# Patient Record
Sex: Male | Born: 1995
Health system: Southern US, Community
[De-identification: ages and names within clinical notes are randomized; demographics above are authoritative.]

## PROBLEM LIST (undated history)

## (undated) DIAGNOSIS — K5909 Other constipation: Secondary | ICD-10-CM

## (undated) DIAGNOSIS — R109 Unspecified abdominal pain: Secondary | ICD-10-CM

## (undated) DIAGNOSIS — K589 Irritable bowel syndrome without diarrhea: Secondary | ICD-10-CM

## (undated) HISTORY — PX: CHOLECYSTECTOMY: SHX55

## (undated) HISTORY — DX: Other constipation: K59.09

## (undated) HISTORY — DX: Unspecified abdominal pain: R10.9

## (undated) HISTORY — DX: Irritable bowel syndrome, unspecified: K58.9

## (undated) HISTORY — PX: MOUTH SURGERY: SHX715

---
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
## 2016-01-04 NOTE — Assessment & Plan Note (Signed)
D/w pt, pt prefers to try otc melatonin first before rx med

## 2016-01-04 NOTE — Assessment & Plan Note (Signed)
Etiology unclear, diff includes hemorrhoid vs fissure, but cant r/o IBD, for GI referral

## 2016-01-04 NOTE — Assessment & Plan Note (Signed)

## 2016-01-04 NOTE — Assessment & Plan Note (Signed)
Etiology unclear, but cant r/o IBD, for GI referral

## 2016-01-06 ENCOUNTER — Encounter: Payer: Self-pay | Admitting: Physician Assistant

## 2016-01-06 ENCOUNTER — Ambulatory Visit (INDEPENDENT_AMBULATORY_CARE_PROVIDER_SITE_OTHER): Payer: 59 | Admitting: Physician Assistant

## 2016-01-06 VITALS — BP 110/70 | HR 76 | Ht 65.0 in | Wt 175.0 lb

## 2016-01-06 DIAGNOSIS — R195 Other fecal abnormalities: Secondary | ICD-10-CM

## 2016-01-06 DIAGNOSIS — R109 Unspecified abdominal pain: Secondary | ICD-10-CM | POA: Diagnosis not present

## 2016-01-06 MED ORDER — HYOSCYAMINE SULFATE SL 0.125 MG SL SUBL: 0.1250 mg | SUBLINGUAL_TABLET | Freq: Four times a day (QID) | SUBLINGUAL | 3 refills | Status: DC | PRN

## 2016-01-06 NOTE — Patient Instructions (Signed)
Your physician has requested that you go to the basement for the following lab work before leaving today: Celiac panel  We have given you samples of the following medication to take: IB guard 2 tabs twice a day. This is also available over the counter.   We have given you a high fiber diet handout. Please strive to have 25-30 mg of fiber daily.  We have given you a LOW FODMAP diet.   We have sent the following medications to your pharmacy for you to pick up at your convenience: Hyoscyamine 0.125 sublingual as needed every 4-6 hrs

## 2016-01-06 NOTE — Progress Notes (Signed)
Agree with assessment and plan with the following recommendations: Agree with low FODMOP diet and trial of hyocyamine and IB Gard. I would recommend a trial of Colestid 1gm BID for this patient who is s/p cholecystectomy, perhaps has some bile salt related diarrhea and this should help. I would hold off on fiber supplementation at this time as well as probiotic and see if this regimen helps. I am happy to see him in follow up. Thanks

## 2016-01-06 NOTE — Progress Notes (Signed)
Chief Complaint: Abdominal Pain  HPI:  [name] is a 20 year old Caucasian male with a past medical history of chronic constipation, who was referred to me by Paul Parsons, Paul W, MD for a complaint of abdominal pain.   Today, the patient presents to clinic accompanied by his mother who does assist with his history. He explains that he had his gallbladder out in 2012 and after that time has never had "normal" bowel movements. His mother tells me that he frequently has abdominal pain and loose stools. Patient tells me that he was doing fine until August when he seemed to have an increase of all of his symptoms. He describes that in the morning he will wake up and have a very sharp gas pain towards the upper half of his stomach, at that point he takes a probiotic and shortly thereafter will have a loose stool. He notes that typically when he has a bowel movement his pain is relieved, but sometimes he can have a generalized discomfort which lasts throughout the day and gives him a decreased appetite. Occasionally he will experience nausea. Typically in the afternoon all the symptoms go away and he is able to eat dinner. Though he does tell me that typically 30 minutes after he eats anything he will develop a gas pain and have a loose bowel movement. This is worse with fatty foods. His mother tells me that at one point he even "pooped out the probiotic full capsule". 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 The patient has been home though for the past 2 weeks and sees no change in his symptoms. His mother tells me they did add flaxseed to his diet to see if this would help as they have a strong family history of IBS, but he has not noticed a difference.   Patient's social history is positive for having seen a therapist in the past for anxiety and insomnia, apparently he meditates on a daily basis at this point.  Patient denies fever, chills, blood in his stool, melena, recent change in diet, recent change in medications, weight loss, fatigue, vomiting, heartburn, reflux or symptoms that awaken him from sleep.  Past Medical History:  Diagnosis Date  . Abdominal pain, recurrent   . Abdominal pain, recurrent   . Asthma   . Chronic constipation    Very large hard stools  . Chronic constipation     Past Surgical History:  Procedure Laterality Date  . CHOLECYSTECTOMY    . ERCP  2012  . MOUTH SURGERY      x 2 , wisdom teeth    Current Outpatient Prescriptions  Medication Sig Dispense Refill  . budesonide-formoterol (SYMBICORT) 160-4.5 MCG/ACT inhaler Inhale 2 puffs into the lungs 2 (two) times daily.    . eszopiclone (LUNESTA) 1 MG TABS tablet Take 1 mg by mouth at bedtime as needed for sleep. Take immediately before bedtime    . Melatonin 10 MG TABS Take 10 mg by mouth daily as needed.     Marland Kitchen. OVER THE COUNTER MEDICATION CBD, 1 capsule nightly    . Hyoscyamine Sulfate SL 0.125 MG SUBL Place 0.125 mg under the tongue every 6 (six) hours as needed. 60 each 3   No current facility-administered medications for this visit.     Allergies as of 01/06/2016 - Review Complete 01/06/2016  Allergen Reaction Noted  . Tetracyclines & related  11/10/2010  .  Zithromax [azithromycin dihydrate]  11/10/2010    Family History  Problem Relation Age of Onset  . Cancer Mother     BREAST  . Alcohol abuse Neg Hx   . Diabetes Neg Hx   . Drug abuse Neg Hx   . Early death Neg Hx   . Heart disease Neg Hx   . Hyperlipidemia Neg Hx   . Hypertension Neg Hx   . Kidney disease Neg Hx   . Stroke Neg Hx     Social History   Social History  . Marital status: Single    Spouse name: N/A  . Number of children: 0  . Years of education: N/A   Occupational History  . student    Social History Main Topics  . Smoking status: Never Smoker  . Smokeless tobacco: Never Used  . Alcohol use No  . Drug use:      Types: Marijuana  . Sexual activity: Not Currently   Other Topics Concern  . Not on file   Social History Narrative  . No narrative on file    Review of Systems:     Constitutional: No weight loss, fever, chills, weakness or fatigue HEENT: Eyes: No change in vision               Ears, Nose, Throat:  No change in hearing  Cardiovascular: No chest pain  Respiratory: No SOB  Gastrointestinal: See HPI and otherwise negative Genitourinary: No dysuria or change in urinary frequency Neurological: No headache, dizziness or syncope Musculoskeletal: No new muscle or joint pain Hematologic: No bleeding or bruising Psychiatric: Positive for anxiety   Physical Exam:  Vital signs: BP 110/70   Pulse 76   Ht 5\' 5"  (1.651 m)   Wt 175 lb (79.4 kg)   BMI 29.12 kg/m   Constitutional:   Pleasant Caucasian male appears to be in NAD, Well developed, Well nourished, alert and cooperative Head:  Normocephalic and atraumatic. Eyes:   PEERL, EOMI. No icterus. Conjunctiva pink. Ears:  Normal auditory acuity. Neck:  Supple Throat: Oral cavity and pharynx without inflammation, swelling or lesion.  Respiratory: Respirations even and unlabored. Lungs clear to auscultation bilaterally.   No wheezes, crackles, or rhonchi.  Cardiovascular: Normal S1, S2. No MRG. Regular rate and rhythm. No peripheral edema, cyanosis or pallor.  Gastrointestinal:  Soft, nondistended, nontender. No rebound or guarding. Normal bowel sounds. No appreciable masses or hepatomegaly. Rectal:  Not performed.  Msk:  Symmetrical without gross deformities. Without edema, no deformity or joint abnormality.  Neurologic:  Alert and  oriented x4;  grossly normal neurologically.  Skin:  Sweaty palms Dry and intact without significant lesions or rashes. Psychiatric:  Demonstrates good judgement and reason without abnormal affect or behaviors. Does appear somewhat anxious  Most Recent Labs: CBC    Component Value Date/Time   WBC 7.3  08/21/2015   WBC 9.0 06/24/2015 1530   RBC 5.47 06/24/2015 1530   HGB 15.6 08/21/2015   HCT 48 08/21/2015   PLT 255 08/21/2015   MCV 85.3 06/24/2015 1530   MCH 30.2 11/27/2010 1825   MCHC 34.3 06/24/2015 1530   RDW 13.1 06/24/2015 1530   LYMPHSABS 3.4 06/24/2015 1530   MONOABS 0.6 06/24/2015 1530   EOSABS 0.2 06/24/2015 1530   BASOSABS 0.0 06/24/2015 1530    CMP     Component Value Date/Time   NA 143 08/21/2015   K 4.5 08/21/2015   CL 100 11/27/2010 1825   CO2 24 11/27/2010 1825  GLUCOSE 81 11/27/2010 1825   BUN 20 08/21/2015   CREATININE 1.1 08/21/2015   CREATININE 0.66 11/27/2010 1825   CALCIUM 10.2 11/27/2010 1825   PROT 7.4 11/27/2010 1825   ALBUMIN 4.1 11/27/2010 1825   AST 18 08/21/2015   ALT 28 08/21/2015   ALKPHOS 110 08/21/2015   BILITOT 4.2 (H) 11/27/2010 1825   GFRNONAA NOT CALCULATED 11/27/2010 1825   GFRAA NOT CALCULATED 11/27/2010 1825    Assessment: 1. Abdominal pain: He describes daily abdominal pain typically upon waking, better after a bowel movement and loose stools and pain after eating throughout the day, does admit to being stressed and anxious on a daily basis, symptoms seem to have increased when he returned to college in the fall, patient is status post cholecystectomy in 2012, has always had some chronic loose stools; most likely patient's symptoms are due to irritable bowel syndrome 2. Loose stools: See above, patient does tell me they have some form, but have always been toward the looser side  Plan: 1. Discussed at great length today irritable bowel syndrome. I did discuss that this is a diagnosis of exclusion and can only truly be diagnosed after colonoscopy is inconclusive. Patient's mother declines a colonoscopy today and prefers to try conservative measures first. 2. Discussed with the patient should continue to find ways to relieve stress and anxiety, also discussed that low-dose SSRIs or TCAs can be used to help with IBS 3. Recommend  the patient increase fiber in his diet to at least 25-35 g per day. Patient was provided with a high fiber handout. Discussed that he can do this as a fiber supplement. 4. Recommend the patient start a daily probiotic such as Align-continue this for at least 2 mos 5. Provided patient with information regarding the low FODMAP diet. 6. Prescribed Hyoscyamine sulfate 0.125 mg sublingual tabs to be used when necessary every 4-6 hours for abdominal cramping 7. Reccomend the patient increase water intake to at least 6-8 8 ounce glasses of water per day 8. Provided the patient with samples of IB Delene RuffiniGard, discussed this can take 4-6 weeks for its full effect, recommend patient start 2 capsules twice a day. Provided him with some samples. 9. Patient to follow in clinic in 2-3 mos with myself or Dr. 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

## 2016-03-01 ENCOUNTER — Ambulatory Visit: Payer: 59 | Admitting: Gastroenterology

## 2016-03-21 ENCOUNTER — Encounter: Payer: Self-pay | Admitting: Physician Assistant

## 2016-03-21 ENCOUNTER — Ambulatory Visit (INDEPENDENT_AMBULATORY_CARE_PROVIDER_SITE_OTHER): Payer: 59 | Admitting: Physician Assistant

## 2016-03-21 VITALS — BP 120/72 | HR 64 | Ht 64.5 in | Wt 187.1 lb

## 2016-03-21 DIAGNOSIS — K580 Irritable bowel syndrome with diarrhea: Secondary | ICD-10-CM | POA: Diagnosis not present

## 2016-03-21 NOTE — Progress Notes (Signed)
Chief Complaint: IBS  HPI:  Mr. Cashman is a 21 year old Caucasian male with a past medical history of chronic constipation, who returns to clinic today for follow-up of his abdominal pain and diarrhea.    Please recall patient was initially seen by me on 01/06/16 and described that since he had his gallbladder out in 2012, he had never had "normal" bowel movements. He described frequent loose stools with abdominal pain. These seemed to be related to stressful situations. He also described occasional nausea. At that time IBS was suspected and patient was started on a high fiber diet as well as a daily probiotic along with IB guard, low-FODMAP diet and hyoscyamine when necessary. He was assigned to Dr. Adela Lank.   Today, the patient tells me that his symptoms are "much improved". The patient did abide by the low FODMAP diet strictly for about 6 weeks and felt that he returned to almost normal with his bowel movements.  He has also been adding fibersupplementation on a daily basis along with IB Guard 1 tab twice a day. He tells me that overall he has regular bowel movements and has no symptoms. He does tell me that occasionally if he has a stressful period in school with a lot of testing he will then get some abdominal cramping in the morning and diarrhea. He tells me he typically will then take hyoscyamine which will help the symptoms. There has only been one or 2 days per the patient where he felt uncertain about going to class due to his diarrhea. Overall he is very happy.   Patient denies fever, chills, blood in his stool, melena, weight loss, fatigue, anorexia, nausea, vomiting, heartburn or reflux.  Past Medical History:  Diagnosis Date  . Abdominal pain, recurrent   . Abdominal pain, recurrent   . Asthma   . Chronic constipation    Very large hard stools  . Chronic constipation     Past Surgical History:  Procedure Laterality Date  . CHOLECYSTECTOMY    . ERCP  2012  . MOUTH SURGERY       x 2 , wisdom teeth    Current Outpatient Prescriptions  Medication Sig Dispense Refill  . budesonide-formoterol (SYMBICORT) 160-4.5 MCG/ACT inhaler Inhale 2 puffs into the lungs as needed.     . eszopiclone (LUNESTA) 1 MG TABS tablet Take 1 mg by mouth at bedtime as needed for sleep. Take immediately before bedtime    . FIBER ADULT GUMMIES PO Take by mouth. Takes two b.i.d    . Hyoscyamine Sulfate SL 0.125 MG SUBL Place 0.125 mg under the tongue every 6 (six) hours as needed. 60 each 3  . Melatonin 10 MG TABS Take 10 mg by mouth daily as needed.     Marland Kitchen PRESCRIPTION MEDICATION Ibgard 1 2x daily     No current facility-administered medications for this visit.     Allergies as of 03/21/2016 - Review Complete 03/21/2016  Allergen Reaction Noted  . Tetracyclines & related  11/10/2010  . Zithromax [azithromycin dihydrate]  11/10/2010    Family History  Problem Relation Age of Onset  . Cancer Mother     BREAST  . Alcohol abuse Neg Hx   . Diabetes Neg Hx   . Drug abuse Neg Hx   . Early death Neg Hx   . Heart disease Neg Hx   . Hyperlipidemia Neg Hx   . Hypertension Neg Hx   . Kidney disease Neg Hx   . Stroke Neg Hx  Social History   Social History  . Marital status: Single    Spouse name: N/A  . Number of children: 0  . Years of education: N/A   Occupational History  . student    Social History Main Topics  . Smoking status: Never Smoker  . Smokeless tobacco: Never Used  . Alcohol use No  . Drug use: Yes    Types: Marijuana  . Sexual activity: Not Currently   Other Topics Concern  . Not on file   Social History Narrative  . No narrative on file    Review of Systems:    Constitutional: No weight loss, fever or chills Cardiovascular: No chest pain Respiratory: No SOB  Gastrointestinal: See HPI and otherwise negative Psychiatric: Positive for anxiety   Physical Exam:  Vital signs: BP 120/72   Pulse 64   Ht 5' 4.5" (1.638 m)   Wt 187 lb 2 oz (84.9  kg)   BMI 31.62 kg/m   Constitutional:   Pleasant Caucasian male appears to be in NAD, Well developed, Well nourished, alert and cooperative Respiratory: Respirations even and unlabored. Lungs clear to auscultation bilaterally.   No wheezes, crackles, or rhonchi.  Cardiovascular: Normal S1, S2. No MRG. Regular rate and rhythm. No peripheral edema, cyanosis or pallor.  Gastrointestinal:  Soft, nondistended, nontender. No rebound or guarding. Normal bowel sounds. No appreciable masses or hepatomegaly. Psychiatric:  Demonstrates good judgement and reason without abnormal affect or behaviors.  No recent labs or imaging.  Assessment: 1. IBS: Suspected at time of last visit with abdominal pain, nausea and diarrhea related to stress and anxiety, patient better now on fiber, IB guard, low-FODMAP diet and hyoscyamine when necessary  Plan: 1. Continue hyoscyamine 0.125 mg sublingual tabs every 4-6 hours when necessary 2. Continue increased fiber 3. Did discuss with the patient that if he has a "bad day", he could try taking Imodium before going to class at least make him feel more secure during that time 4. Continue IB guard. Did provide the patient with further coupons for this 1 tab twice a day 5. Patient to follow in clinic with Dr. Adela LankArmbruster or myself as needed in the future.  Hyacinth MeekerJennifer Magda Muise, PA-C [facility] 03/21/2016, 11:32 AM  Cc: Corwin LevinsJohn, [name], MD

## 2016-03-21 NOTE — Patient Instructions (Signed)
If you are age 20 or older, your body mass index should be between 23-30. Your Body mass index is 31.62 kg/m. If this is out of the aforementioned range listed, please consider follow up with your Primary Care Provider.  If you are age 20 or younger, your body mass index should be between 19-25. Your Body mass index is 31.62 kg/m. If this is out of the aformentioned range listed, please consider follow up with your Primary Care Provider.  Follow up as needed.   Thank you for choosing [facility]

## 2016-03-21 NOTE — Progress Notes (Signed)
Agree with assessment and plan. If loose stools persist would try him on colestid or cholestryramine in the future given history of cholecystectomy. He can follow up as needed.

## 2016-05-25 ENCOUNTER — Other Ambulatory Visit: Payer: Self-pay | Admitting: Internal Medicine

## 2016-05-25 NOTE — Telephone Encounter (Signed)
Done hardcopy to Shirron  

## 2016-05-26 NOTE — Telephone Encounter (Signed)
faxed

## 2016-05-31 ENCOUNTER — Telehealth: Payer: Self-pay | Admitting: Physician Assistant

## 2016-05-31 NOTE — Telephone Encounter (Signed)
Patient mother states that patient needs stomach medication refilled at [facility]. If pt needs to come in for ov he can come august 6-9 bc he will be in from out of town where he lives.

## 2016-06-01 MED ORDER — HYOSCYAMINE SULFATE SL 0.125 MG SL SUBL: 0.1250 mg | SUBLINGUAL_TABLET | Freq: Four times a day (QID) | SUBLINGUAL | 1 refills | Status: DC | PRN

## 2016-06-01 NOTE — Telephone Encounter (Signed)
Hyoscyamine rx  sent to [facility] for number 180 with 1 refill.

## 2016-07-04 ENCOUNTER — Telehealth: Payer: Self-pay | Admitting: Physician Assistant

## 2016-07-04 NOTE — Telephone Encounter (Signed)
Patient moved to Dr. Venida JarvisArmbrusters schedule

## 2016-07-07 ENCOUNTER — Ambulatory Visit (INDEPENDENT_AMBULATORY_CARE_PROVIDER_SITE_OTHER): Payer: 59 | Admitting: Gastroenterology

## 2016-07-07 ENCOUNTER — Other Ambulatory Visit: Payer: Self-pay | Admitting: Gastroenterology

## 2016-07-07 ENCOUNTER — Encounter (INDEPENDENT_AMBULATORY_CARE_PROVIDER_SITE_OTHER): Payer: Self-pay

## 2016-07-07 ENCOUNTER — Ambulatory Visit: Payer: 59 | Admitting: Physician Assistant

## 2016-07-07 ENCOUNTER — Telehealth: Payer: Self-pay | Admitting: Gastroenterology

## 2016-07-07 ENCOUNTER — Encounter: Payer: Self-pay | Admitting: Gastroenterology

## 2016-07-07 VITALS — BP 118/76 | HR 73 | Ht 65.0 in | Wt 198.0 lb

## 2016-07-07 DIAGNOSIS — K580 Irritable bowel syndrome with diarrhea: Secondary | ICD-10-CM | POA: Diagnosis not present

## 2016-07-07 MED ORDER — AMITRIPTYLINE HCL 25 MG PO TABS: 25.0000 mg | ORAL_TABLET | Freq: Every day | ORAL | 3 refills | Status: DC

## 2016-07-07 NOTE — Telephone Encounter (Signed)
At pts appt today with Armbruster I sent in Elavil to [facility] on [address]. I left message on voicemail informing of this. Asked for mom or son to return my call if it needs to be sent to [facility]. I did not feel comfortable sending the script to the fax provided until I had spoken to one of them.

## 2016-07-07 NOTE — Progress Notes (Signed)
HPI :  21 year old male with a suspected history of irritable bowel syndrome, known to [employer] from prior visits, new to me.   Patient thinks he is doing well in general with occasional flares of symptoms. He reports when stressed out with exams at school, he gets gassey and pain with his bowel movements. Pain is relieved with a bowel movement. He thinks symptoms ongoing for roughly a year or so. He feels it most when he is stressed out and types of food he eats. Since going to college his eating habits have been altered.   He reports about BM once per day at baseline. When feeling poorly he has more frequent bowel movements - smaller loose stools. No blood in the stools. Pain is in the mid to lower abdomen. It is reliably relieved with a bowel movement. Weight is stable - no weight loss. No nausea or vomiting at baseline. Hyocyamine will help his pain if he needs it. Spicey foods bother him most or high fatty foods. He thinks most of the food on his campus where he goes to school bothers him most. He has had his gallbladder removed in 2012. He thinks he has had loose stools since the gallbladder has been removed.   He thinks IB gard helps him significantly, taking it twice daily. He has tried a low FODMAP diet which is hard to follow being in school but he thinks helped when he took it.     Past Medical History:  Diagnosis Date  . Abdominal pain, recurrent   . Abdominal pain, recurrent   . Asthma   . Chronic constipation    Very large hard stools  . Chronic constipation   . IBS (irritable bowel syndrome)      Past Surgical History:  Procedure Laterality Date  . CHOLECYSTECTOMY    . ERCP  2012  . MOUTH SURGERY      x 2 , wisdom teeth   Family History  Problem Relation Age of Onset  . Cancer Mother        BREAST  . Alcohol abuse Neg Hx   . Diabetes Neg Hx   . Drug abuse Neg Hx   . Early death Neg Hx   . Heart disease Neg Hx   . Hyperlipidemia Neg Hx   . Hypertension  Neg Hx   . Kidney disease Neg Hx   . Stroke Neg Hx    Social History  Substance Use Topics  . Smoking status: Never Smoker  . Smokeless tobacco: Never Used  . Alcohol use No   Current Outpatient Prescriptions  Medication Sig Dispense Refill  . budesonide-formoterol (SYMBICORT) 160-4.5 MCG/ACT inhaler Inhale 2 puffs into the lungs as needed.     . eszopiclone (LUNESTA) 1 MG TABS tablet TAKE 1 TABLET AT BEDTIME (TAKE IMMEDIATELY BEFORE BEDTIME) 30 tablet 2  . FIBER ADULT GUMMIES PO Take by mouth. Takes two b.i.d    . Hyoscyamine Sulfate SL 0.125 MG SUBL Place 0.125 mg under the tongue every 6 (six) hours as needed. 180 each 1  . Melatonin 10 MG TABS Take 10 mg by mouth daily as needed.     Marland Kitchen PRESCRIPTION MEDICATION Ibgard 1 2x daily    . amitriptyline (ELAVIL) 25 MG tablet Take 1 tablet (25 mg total) by mouth at bedtime. Take 1 tablet qhs for 2 weeks. Then increase to 2 tablets qhs if needed 90 tablet 3   No current facility-administered medications for this visit.    Allergies  Allergen Reactions  . Tetracyclines & Related   . Zithromax [Azithromycin Dihydrate]      Review of Systems: All systems reviewed and negative except where noted in HPI.   Lab Results  Component Value Date   WBC 7.3 08/21/2015   HGB 15.6 08/21/2015   HCT 48 08/21/2015   MCV 85.3 06/24/2015   PLT 255 08/21/2015    Lab Results  Component Value Date   CREATININE 1.1 08/21/2015   BUN 20 08/21/2015   NA 143 08/21/2015   K 4.5 08/21/2015   CL 100 11/27/2010   CO2 24 11/27/2010     Physical Exam: BP 118/76   Pulse 73   Ht 5\' 5"  (1.651 m)   Wt 198 lb (89.8 kg)   BMI 32.95 kg/m  Constitutional: Pleasant,well-developed, male in no acute distress. HEENT: Normocephalic and atraumatic. Conjunctivae are normal. No scleral icterus. Neck supple.  Cardiovascular: Normal rate, regular rhythm.  Pulmonary/chest: Effort normal and breath sounds normal. No wheezing, rales or rhonchi. Abdominal: Soft,  nondistended, nontender. There are no masses palpable. No hepatomegaly. Extremities: no edema Lymphadenopathy: No cervical adenopathy noted. Neurological: Alert and oriented to person place and time. Skin: Skin is warm and dry. No rashes noted. Psychiatric: Normal mood and affect. Behavior is normal.   ASSESSMENT AND PLAN: 21 year old male here for reassessment for what is suspected to be irritable bowel syndrome. He has chronic loose stools with bloating, abdominal pain that is reliably relieved with a bowel movement. No alarm symptoms or anemia. He is a [employer] student, symptoms worse when he cannot control his diet and when under stress of exams. He responded nicely to IB [place] and hyoscyamine as needed, although continues to have symptoms at times.  We discussed long-term options for maintenance therapy to reduce his use of as needed medications. He reports abdominal cramping as well as bothers him most, in this light I think Elavil may be a good option for him. I discussed risks and benefits of this and we'll start him at 25 mg daily at bedtime 2 weeks. If he tolerates this well we can increase Elavil to 50 mg daily at bedtime. While I hope this helps reduce his stools as well, if diarrhea persists he can also try Colestid as needed given his history of cholecystectomy. In the interim going to screen him for celiac disease to ensure negative, this has not been done.  He can follow-up with me in 6 months or moving forward call with questions or concerns in the interim.Ileene Patrick.  Willene Holian, MD [facility] Pager [phone]

## 2016-07-07 NOTE — Patient Instructions (Signed)
If you are age 20 or older, your body mass index should be between 23-30. Your Body mass index is 32.95 kg/m. If this is out of the aforementioned range listed, please consider follow up with your Primary Care Provider.  If you are age 20 or younger, your body mass index should be between 19-25. Your Body mass index is 32.95 kg/m. If this is out of the aformentioned range listed, please consider follow up with your Primary Care Provider.   We have sent the following medications to your pharmacy for you to pick up at your convenience:  Elavil  Please continue Hyocyamine and IBgard as instructed.  Thank you.

## 2016-07-07 NOTE — Telephone Encounter (Signed)
Spoke with pts mom. She is needing the lab order sent to [facility]. I informed that the order was attached to pts AVS when he left the appt today. She states that son did not bring it with him to [facility]. Order faxed to [phone].

## 2016-07-08 ENCOUNTER — Ambulatory Visit: Payer: 59 | Admitting: Physician Assistant

## 2016-07-08 LAB — TISSUE TRANSGLUTAMINASE, IGG: Tissue Transglut Ab: <2 U/mL (ref 0–5)

## 2016-07-08 LAB — TISSUE TRANSGLUTAMINASE, IGA: Transglutaminase IgA: <2 U/mL (ref 0–3)

## 2016-07-11 ENCOUNTER — Telehealth: Payer: Self-pay | Admitting: Gastroenterology

## 2016-07-11 NOTE — Telephone Encounter (Signed)
Patient started taking the amitriptyline the other night, along with his melatonin. States this makes him very groggy, is difficult to wake up in the morning. He took 1/2 tablet of the amitriptyline along with the melatonin, with the same affect. Instructed him to not take the amitriptyline tonight, would ask the doctor about this and get back with him tomorrow. Please advise.

## 2016-07-12 ENCOUNTER — Encounter: Payer: Self-pay | Admitting: Gastroenterology

## 2016-07-12 NOTE — Telephone Encounter (Signed)
Thanks for the update. He should try taking the Elavil at 1/2 pill without the melatonin. If still the same side effect without using melatonin then will need to consider an alternative. Usually most people don't have this side effect at the low dose but if it persists he should contact us and will discuss using an alternative. Thanks

## 2016-07-12 NOTE — Telephone Encounter (Signed)
Spoke to patient, he did try taking the Elavil 1/2 tablet and no melatonin last night, still somewhat groggy this morning. He will try this a couple more days, and if still feeling like this will call back to see about changing medications.

## 2016-08-08 ENCOUNTER — Telehealth: Payer: Self-pay

## 2016-08-08 ENCOUNTER — Other Ambulatory Visit: Payer: Self-pay

## 2016-08-08 MED ORDER — COLESTIPOL HCL 1 G PO TABS: 1.0000 g | ORAL_TABLET | Freq: Two times a day (BID) | ORAL | 3 refills | Status: DC

## 2016-08-08 NOTE — Telephone Encounter (Signed)
Thanks Raynelle FanningJulie, sorry to hear he isn't responding well to the Elavil. Recommend he stop it altogether at this point. Why don't we try him on colestid 1gm BID given his history of cholecystectomy and see if this helps his diarrhea. He can follow up with me in 3 months for reassessment in the clinic. Thanks

## 2016-08-08 NOTE — Telephone Encounter (Signed)
See last phone note (6/25) about medication. Please advise.

## 2016-08-08 NOTE — Telephone Encounter (Signed)
Pt has been taking the Elavil for about a month now. He doesn't see it helping it only makes him tired and feels groggy in the mornings.  Best phone number to reach pt is [phone]

## 2016-08-08 NOTE — Telephone Encounter (Signed)
Left message for patient to call back to office. I have sent Rx for Colestid to his pharmacy. Need to speak to him about Dr. Lanetta InchArmbruster's recommendations to stop Elavil, try Colestid and follow up in office in 3 months.

## 2016-08-10 ENCOUNTER — Other Ambulatory Visit: Payer: Self-pay

## 2016-08-10 NOTE — Telephone Encounter (Signed)
I have not heard back from patient, sent him letter with Dr. Lanetta InchArmbruster's recommendations. Asked him to contact our office to schedule a 3 month follow up visit. If he had questions or concerns prior to visit to please call our office.

## 2016-08-23 ENCOUNTER — Ambulatory Visit: Payer: 59 | Admitting: Internal Medicine

## 2016-08-26 ENCOUNTER — Ambulatory Visit (INDEPENDENT_AMBULATORY_CARE_PROVIDER_SITE_OTHER): Payer: 59 | Admitting: Internal Medicine

## 2016-08-26 ENCOUNTER — Encounter: Payer: Self-pay | Admitting: Internal Medicine

## 2016-08-26 VITALS — BP 126/82 | HR 69 | Ht 65.0 in | Wt 202.0 lb

## 2016-08-26 DIAGNOSIS — J309 Allergic rhinitis, unspecified: Secondary | ICD-10-CM | POA: Diagnosis not present

## 2016-08-26 DIAGNOSIS — Z0000 Encounter for general adult medical examination without abnormal findings: Secondary | ICD-10-CM

## 2016-08-26 DIAGNOSIS — E669 Obesity, unspecified: Secondary | ICD-10-CM

## 2016-08-26 DIAGNOSIS — F5101 Primary insomnia: Secondary | ICD-10-CM | POA: Diagnosis not present

## 2016-08-26 DIAGNOSIS — F419 Anxiety disorder, unspecified: Secondary | ICD-10-CM | POA: Diagnosis not present

## 2016-08-26 MED ORDER — ESZOPICLONE 1 MG PO TABS: ORAL_TABLET | ORAL | 1 refills | Status: DC

## 2016-08-26 NOTE — Patient Instructions (Signed)

## 2016-08-26 NOTE — Progress Notes (Signed)
Subjective:    Patient ID: Paul Parsons, male    DOB: 02-07-1995, 21 y.o.   MRN: [id number]  HPI  Here to f/u before fall class start at [employer]. Does have several wks ongoing nasal allergy symptoms with clearish congestion, itch and sneezing, without fever, pain, ST, cough, swelling or wheezing.  Has increased stress that at times seems overwhelming, Denies worsening depressive symptoms, suicidal ideation, or panic; has ongoing anxiety.  Biggest complaint is also getting to sleep most nights in the past month have been very difficult, mind just wont stop.  Not waking up early.  Pt denies fever, wt loss, night sweats, loss of appetite, or other constitutional symptoms Past Medical History:  Diagnosis Date  . Abdominal pain, recurrent   . Abdominal pain, recurrent   . Asthma   . Chronic constipation    Very large hard stools  . Chronic constipation   . IBS (irritable bowel syndrome)    Past Surgical History:  Procedure Laterality Date  . CHOLECYSTECTOMY    . ERCP  2012  . MOUTH SURGERY      x 2 , wisdom teeth    reports that he has never smoked. He has never used smokeless tobacco. He reports that he uses drugs, including Marijuana. He reports that he does not drink alcohol. family history includes Cancer in his mother. Allergies  Allergen Reactions  . Tetracyclines & Related   . Zithromax [Azithromycin Dihydrate]    Current Outpatient Prescriptions on File Prior to Visit  Medication Sig Dispense Refill  . amitriptyline (ELAVIL) 25 MG tablet Take 1 tablet (25 mg total) by mouth at bedtime. Take 1 tablet qhs for 2 weeks. Then increase to 2 tablets qhs if needed 90 tablet 3  . budesonide-formoterol (SYMBICORT) 160-4.5 MCG/ACT inhaler Inhale 2 puffs into the lungs as needed.     . colestipol (COLESTID) 1 g tablet Take 1 tablet (1 g total) by mouth 2 (two) times daily. 60 tablet 3  . FIBER ADULT GUMMIES PO Take by mouth. Takes two b.i.d    . Hyoscyamine Sulfate SL 0.125 MG  SUBL Place 0.125 mg under the tongue every 6 (six) hours as needed. 180 each 1  . Melatonin 10 MG TABS Take 10 mg by mouth daily as needed.     Marland Kitchen PRESCRIPTION MEDICATION Ibgard 1 2x daily     No current facility-administered medications on file prior to visit.    Review of Systems  Constitutional: Negative for other unusual diaphoresis or sweats HENT: Negative for ear discharge or swelling Eyes: Negative for other worsening visual disturbances Respiratory: Negative for stridor or other swelling  Gastrointestinal: Negative for worsening distension or other blood Genitourinary: Negative for retention or other urinary change Musculoskeletal: Negative for other MSK pain or swelling Skin: Negative for color change or other new lesions Neurological: Negative for worsening tremors and other numbness  Psychiatric/Behavioral: Negative for worsening agitation or other fatigue All other system neg per pt    Objective:   Physical Exam BP 126/82   Pulse 69   Ht 5\' 5"  (1.651 m)   Wt 202 lb (91.6 kg)   SpO2 99%   BMI 33.61 kg/m  VS noted,  Constitutional: Pt appears in NAD HENT: Head: NCAT.  Right Ear: External ear normal.  Left Ear: External ear normal.  Eyes: . Pupils are equal, round, and reactive to light. Conjunctivae and EOM are normal Bilat tm's with mild erythema.  Max sinus areas non tender.  Pharynx with  mild erythema, no exudate Nose: without d/c or deformity Neck: Neck supple. Gross normal ROM Cardiovascular: Normal rate and regular rhythm.   Pulmonary/Chest: Effort normal and breath sounds without rales or wheezing.  Neurological: Pt is alert. At baseline orientation, motor grossly intact Skin: Skin is warm. No rashes, other new lesions, no LE edema Psychiatric: Pt behavior is normal without agitation , 1-2+ nervous,k not depressed affect No other exam findings Lab Results  Component Value Date   TSH 2.070 03/18/2014      Assessment & Plan:

## 2016-08-29 DIAGNOSIS — F419 Anxiety disorder, unspecified: Secondary | ICD-10-CM | POA: Insufficient documentation

## 2016-08-29 NOTE — Assessment & Plan Note (Signed)
Urged for more regular exercise, wt control

## 2016-08-29 NOTE — Assessment & Plan Note (Signed)
Ok for lunesta qhs prn,  to f/u any worsening symptoms or concerns

## 2016-08-29 NOTE — Assessment & Plan Note (Signed)
Mild, pt prefers otc claritin prn,  to f/u any worsening symptoms or concerns

## 2016-08-29 NOTE — Assessment & Plan Note (Signed)
Mild to mod, no panic or depression, after d/w pt he [facility] where [facility] soon, plans to self refer

## 2016-09-01 ENCOUNTER — Telehealth: Payer: Self-pay | Admitting: Gastroenterology

## 2016-09-01 NOTE — Telephone Encounter (Signed)
Notified patient of this information

## 2016-09-01 NOTE — Telephone Encounter (Signed)
I have not seen any paperwork. I checked Dr. Lanetta InchArmbruster's desk and forms are not there. I have also checked in my files and I have nothing. Will you please inform his mom. Thank you.

## 2016-09-09 NOTE — Telephone Encounter (Signed)
Have you seen this paperwork?

## 2016-10-18 ENCOUNTER — Telehealth: Payer: Self-pay | Admitting: Gastroenterology

## 2016-10-18 ENCOUNTER — Other Ambulatory Visit: Payer: Self-pay

## 2016-10-18 MED ORDER — COLESTIPOL HCL 1 G PO TABS: 1.0000 g | ORAL_TABLET | Freq: Two times a day (BID) | ORAL | 0 refills | Status: DC

## 2016-11-05 ENCOUNTER — Other Ambulatory Visit: Payer: Self-pay | Admitting: Gastroenterology

## 2016-12-07 ENCOUNTER — Telehealth: Payer: Self-pay

## 2016-12-07 NOTE — Telephone Encounter (Signed)
This is the wrong pt. It was his father who called about himself. Message created in error.

## 2016-12-07 NOTE — Telephone Encounter (Signed)
pt states that [facility] is closing in [place] and he needs to find someone that will take his insurance which is [employer].  do you know of another locations. he can try to get the adhd testing done at

## 2016-12-07 NOTE — Telephone Encounter (Signed)
i dont see in the system where you seen this patient did you see in the hopsital ?

## 2017-01-04 ENCOUNTER — Encounter: Payer: Self-pay | Admitting: Physician Assistant

## 2017-01-04 ENCOUNTER — Encounter: Payer: Self-pay | Admitting: Internal Medicine

## 2017-01-04 ENCOUNTER — Ambulatory Visit: Payer: 59 | Admitting: Physician Assistant

## 2017-01-04 ENCOUNTER — Ambulatory Visit: Payer: 59 | Admitting: Internal Medicine

## 2017-01-04 VITALS — BP 128/86 | HR 100 | Temp 98.2°F | Ht 65.0 in | Wt 213.0 lb

## 2017-01-04 VITALS — BP 108/76 | HR 70 | Ht 65.0 in | Wt 212.0 lb

## 2017-01-04 DIAGNOSIS — Z0000 Encounter for general adult medical examination without abnormal findings: Secondary | ICD-10-CM

## 2017-01-04 DIAGNOSIS — K580 Irritable bowel syndrome with diarrhea: Secondary | ICD-10-CM

## 2017-01-04 DIAGNOSIS — F5101 Primary insomnia: Secondary | ICD-10-CM | POA: Diagnosis not present

## 2017-01-04 MED ORDER — ESZOPICLONE 2 MG PO TABS: 2.0000 mg | ORAL_TABLET | Freq: Every evening | ORAL | 0 refills | Status: DC | PRN

## 2017-01-04 NOTE — Progress Notes (Signed)
Chief Complaint: IBS with diarrhea  HPI:    [name] is a 21 year old male with a past medical history as listed below including IBS with diarrhea, who follows with Dr. Adela LankArmbruster and returns to clinic today for follow-up.    Please recall patient was last seen in clinic 07/07/16 by Dr. Adela LankArmbruster and at that time had been doing some better on FD Gard and hyoscyamine.  He was started on Elavil 25 mg at bedtime for 2 weeks.  He was also started on Colestid.  Patient later called our clinic and said he could not tolerate the Elavil.  This was discontinued.  He was recommended to take Colestid 1 g twice daily.    Today, the patient returns to clinic and tells me that he is "way better than when I first came in here".  Patient tells me that about 3 weeks ago he developed a "stomach bug", he does tell me the type of bacteria and that he was on antibiotics for about a week.  He tells me that after stopping the antibiotics that he did have some nausea residually for a few days, but this is much better.  He has noted a decrease in appetite though since this time.  He tells me this could be related to this or he also due to the fact that he had an increase in stress of at the end of the year due to finals at school.  He is now off from school and tells me that his appetite is picking back up but "still not quite normal".    Patient also continues with a generalized abdominal discomfort when he wakes up in the morning, sometimes this discomfort will grow and result in a liquid bowel movement at other times will not.  The patient is currently using his hyoscyamine as needed and tells me that in the morning if this pain gets really bad he will take one and it does help to "take the edge off".  He is taking his Colestid 1 g twice daily and is unsure if this is helping but does tell me that he has had a decrease in diarrhea.  Overall the patient is "pretty happy".      Patient denies fever, chills, blood in stool,  melena, weight loss, anorexia, vomiting or symptoms that awaken him at night.  Past Medical History:  Diagnosis Date  . Abdominal pain, recurrent   . Abdominal pain, recurrent   . Asthma   . Chronic constipation    Very large hard stools  . Chronic constipation   . IBS (irritable bowel syndrome)     Past Surgical History:  Procedure Laterality Date  . CHOLECYSTECTOMY    . ERCP  2012  . MOUTH SURGERY      x 2 , wisdom teeth    Current Outpatient Medications  Medication Sig Dispense Refill  . amitriptyline (ELAVIL) 25 MG tablet Take 1 tablet (25 mg total) by mouth at bedtime. Take 1 tablet qhs for 2 weeks. Then increase to 2 tablets qhs if needed 90 tablet 3  . budesonide-formoterol (SYMBICORT) 160-4.5 MCG/ACT inhaler Inhale 2 puffs into the lungs as needed.     . colestipol (COLESTID) 1 g tablet TAKE 1 TABLET BY MOUTH TWO  TIMES DAILY 180 tablet 0  . eszopiclone (LUNESTA) 1 MG TABS tablet TAKE 1 TABLET AT BEDTIME (TAKE IMMEDIATELY BEFORE BEDTIME) 90 tablet 1  . FIBER ADULT GUMMIES PO Take by mouth. Takes two b.i.d    .  Hyoscyamine Sulfate SL 0.125 MG SUBL Place 0.125 mg under the tongue every 6 (six) hours as needed. 180 each 1  . Melatonin 10 MG TABS Take 10 mg by mouth daily as needed.     Marland Kitchen PRESCRIPTION MEDICATION Ibgard 1 2x daily     No current facility-administered medications for this visit.     Allergies as of 01/04/2017 - Review Complete 01/04/2017  Allergen Reaction Noted  . Tetracyclines & related  11/10/2010  . Zithromax [azithromycin dihydrate]  11/10/2010    Family History  Problem Relation Age of Onset  . Breast cancer Mother        BREAST  . Alcohol abuse Neg Hx   . Diabetes Neg Hx   . Drug abuse Neg Hx   . Early death Neg Hx   . Heart disease Neg Hx   . Hyperlipidemia Neg Hx   . Hypertension Neg Hx   . Kidney disease Neg Hx   . Stroke Neg Hx   . Colon cancer Neg Hx   . Stomach cancer Neg Hx     Social History   Socioeconomic History  .  Marital status: Single    Spouse name: Not on file  . Number of children: 0  . Years of education: Not on file  . Highest education level: Not on file  Social Needs  . Financial resource strain: Not on file  . Food insecurity - worry: Not on file  . Food insecurity - inability: Not on file  . Transportation needs - medical: Not on file  . Transportation needs - non-medical: Not on file  Occupational History  . Occupation: Consulting civil engineer  Tobacco Use  . Smoking status: Never Smoker  . Smokeless tobacco: Never Used  Substance and Sexual Activity  . Alcohol use: No    Alcohol/week: 1.8 oz    Types: 3 Cans of beer per week  . Drug use: No    Comment: former  . Sexual activity: Not Currently  Other Topics Concern  . Not on file  Social History Narrative  . Not on file    Review of Systems:    Constitutional: No weight loss, fever or chills Cardiovascular: No chest pain Respiratory: No SOB Gastrointestinal: See HPI and otherwise negative   Physical Exam:  Vital signs: BP 108/76   Pulse 70   Ht 5\' 5"  (1.651 m)   Wt 212 lb (96.2 kg)   BMI 35.28 kg/m   Constitutional:   Pleasant overweight Caucasian male appears to be in NAD, Well developed, Well nourished, alert and cooperative Respiratory: Respirations even and unlabored. Lungs clear to auscultation bilaterally.   No wheezes, crackles, or rhonchi.  Cardiovascular: Normal S1, S2. No MRG. Regular rate and rhythm. No peripheral edema, cyanosis or pallor.  Gastrointestinal:  Soft, nondistended, nontender. No rebound or guarding. Normal bowel sounds. No appreciable masses or hepatomegaly. Psychiatric:  Demonstrates good judgement and reason without abnormal affect or behaviors.  RELEVANT LABS AND IMAGING: CBC    Component Value Date/Time   WBC 7.3 08/21/2015   WBC 9.0 06/24/2015 1530   RBC 5.47 06/24/2015 1530   HGB 15.6 08/21/2015   HCT 48 08/21/2015   PLT 255 08/21/2015   MCV 85.3 06/24/2015 1530   MCH 30.2 11/27/2010 1825    MCHC 34.3 06/24/2015 1530   RDW 13.1 06/24/2015 1530   LYMPHSABS 3.4 06/24/2015 1530   MONOABS 0.6 06/24/2015 1530   EOSABS 0.2 06/24/2015 1530   BASOSABS 0.0 06/24/2015 1530  CMP     Component Value Date/Time   NA 143 08/21/2015   K 4.5 08/21/2015   CL 100 11/27/2010 1825   CO2 24 11/27/2010 1825   GLUCOSE 81 11/27/2010 1825   BUN 20 08/21/2015   CREATININE 1.1 08/21/2015   CREATININE 0.66 11/27/2010 1825   CALCIUM 10.2 11/27/2010 1825   PROT 7.4 11/27/2010 1825   ALBUMIN 4.1 11/27/2010 1825   AST 18 08/21/2015   ALT 28 08/21/2015   ALKPHOS 110 08/21/2015   BILITOT 4.2 (H) 11/27/2010 1825   GFRNONAA NOT CALCULATED 11/27/2010 1825   GFRAA NOT CALCULATED 11/27/2010 1825    Assessment: 1.  IBS-D: Patient did not tolerate Elavil, continues on Colestid 1 g twice daily, Hyoscyamine as needed and FD Gard 1 tab twice daily, does continue with some days of discomfort in the morning and occasional loose stools  Plan: 1.  Discussed with the patient that likely his decrease in appetite is related to recent antibiotic use, but suspect that this picks up in the next month or so now that he is out of school and away from stress and has finished his antibiotics. 2.  Continue Colestid 1 g twice daily, continue hyoscyamine.  Did discuss that he should schedule his Hyoscyamine  nightly and every morning for the next few weeks to see if this helps with his morning discomfort. 3.  Patient to continue his FD Delene RuffiniGard 1 tab twice daily.  He tells me this is "magic". 4.  Patient to follow in clinic in 6 months with Dr. Adela LankArmbruster myself.  Hyacinth MeekerJennifer Mickell Birdwell, PA-C [facility] 01/04/2017, 2:46 PM  Cc: Corwin LevinsJohn, [name], MD

## 2017-01-04 NOTE — Progress Notes (Signed)
Subjective:    Patient ID: Paul HardLucas Parsons, male    DOB: 04/09/1995, 21 y.o.   MRN: [id number]  HPI  Here for wellness and f/u;  Overall doing ok;  Pt denies Chest pain, worsening SOB, DOE, wheezing, orthopnea, PND, worsening LE edema, palpitations, dizziness or syncope.  Pt denies neurological change such as new headache, facial or extremity weakness.  Pt denies polydipsia, polyuria, or low sugar symptoms. Pt states overall good compliance with treatment and medications, good tolerability, and has been trying to follow appropriate diet.  Pt denies worsening depressive symptoms, suicidal ideation or panic. No fever, night sweats, wt loss, loss of appetite, or other constitutional symptoms.  Pt states good ability with ADL's, has low fall risk, home safety reviewed and adequate, no other significant changes in hearing or vision, and occasionally active with exercise.  Lunesta not working as well at 1 mg, no metal taste. Declines flu shot. Past Medical History:  Diagnosis Date  . Abdominal pain, recurrent   . Abdominal pain, recurrent   . Asthma   . Chronic constipation    Very large Parsons stools  . Chronic constipation   . IBS (irritable bowel syndrome)    Past Surgical History:  Procedure Laterality Date  . CHOLECYSTECTOMY    . ERCP  2012  . MOUTH SURGERY      x 2 , wisdom teeth    reports that  has never smoked. he has never used smokeless tobacco. He reports that he does not drink alcohol or use drugs. family history includes Breast cancer in his mother. Allergies  Allergen Reactions  . Tetracyclines & Related   . Zithromax [Azithromycin Dihydrate]    Current Outpatient Medications on File Prior to Visit  Medication Sig Dispense Refill  . amitriptyline (ELAVIL) 25 MG tablet Take 1 tablet (25 mg total) by mouth at bedtime. Take 1 tablet qhs for 2 weeks. Then increase to 2 tablets qhs if needed 90 tablet 3  . budesonide-formoterol (SYMBICORT) 160-4.5 MCG/ACT inhaler Inhale 2 puffs  into the lungs as needed.     . colestipol (COLESTID) 1 g tablet TAKE 1 TABLET BY MOUTH TWO  TIMES DAILY 180 tablet 0  . FIBER ADULT GUMMIES PO Take by mouth. Takes two b.i.d    . Hyoscyamine Sulfate SL 0.125 MG SUBL Place 0.125 mg under the tongue every 6 (six) hours as needed. 180 each 1  . Melatonin 10 MG TABS Take 10 mg by mouth daily as needed.     Marland Kitchen. PRESCRIPTION MEDICATION Ibgard 1 2x daily     No current facility-administered medications on file prior to visit.    Review of Systems Constitutional: Negative for other unusual diaphoresis, sweats, appetite or weight changes HENT: Negative for other worsening hearing loss, ear pain, facial swelling, mouth sores or neck stiffness.   Eyes: Negative for other worsening pain, redness or other visual disturbance.  Respiratory: Negative for other stridor or swelling Cardiovascular: Negative for other palpitations or other chest pain  Gastrointestinal: Negative for worsening diarrhea or loose stools, blood in stool, distention or other pain Genitourinary: Negative for hematuria, flank pain or other change in urine volume.  Musculoskeletal: Negative for myalgias or other joint swelling.  Skin: Negative for other color change, or other wound or worsening drainage.  Neurological: Negative for other syncope or numbness. Hematological: Negative for other adenopathy or swelling Psychiatric/Behavioral: Negative for hallucinations, other worsening agitation, SI, self-injury, or new decreased concentration All other system neg pe rpt  Objective:   Physical Exam BP 128/86   Pulse 100   Temp 98.2 F (36.8 C) (Oral)   Ht 5\' 5"  (1.651 m)   Wt 213 lb (96.6 kg)   SpO2 98%   BMI 35.45 kg/m  VS noted,  Constitutional: Pt is oriented to person, place, and time. Appears well-developed and well-nourished, in no significant distress and comfortable Head: Normocephalic and atraumatic  Eyes: Conjunctivae and EOM are normal. Pupils are equal, round, and  reactive to light Right Ear: External ear normal without discharge Left Ear: External ear normal without discharge Nose: Nose without discharge or deformity Mouth/Throat: Oropharynx is without other ulcerations and moist  Neck: Normal range of motion. Neck supple. No JVD present. No tracheal deviation present or significant neck LA or mass Cardiovascular: Normal rate, regular rhythm, normal heart sounds and intact distal pulses.   Pulmonary/Chest: WOB normal and breath sounds without rales or wheezing  Abdominal: Soft. Bowel sounds are normal. NT. No HSM  Musculoskeletal: Normal range of motion. Exhibits no edema Lymphadenopathy: Has no other cervical adenopathy.  Neurological: Pt is alert and oriented to person, place, and time. Pt has normal reflexes. No cranial nerve deficit. Motor grossly intact, Gait intact Skin: Skin is warm and dry. No rash noted or new ulcerations Psychiatric:  Has normal mood and affect. Behavior is normal without agitation No other exam findings Lab Results  Component Value Date   WBC 7.3 08/21/2015   HGB 15.6 08/21/2015   HCT 48 08/21/2015   PLT 255 08/21/2015   GLUCOSE 81 11/27/2010   CHOL 109 08/21/2015   TRIG 99 08/21/2015   HDL 41 08/21/2015   LDLCALC 48 08/21/2015   ALT 28 08/21/2015   AST 18 08/21/2015   NA 143 08/21/2015   K 4.5 08/21/2015   CL 100 11/27/2010   CREATININE 1.1 08/21/2015   BUN 20 08/21/2015   CO2 24 11/27/2010   TSH 2.070 03/18/2014   Declines further labs today    Assessment & Plan:

## 2017-01-04 NOTE — Patient Instructions (Signed)
Ok to increase the lunesta to 2 mg  Please continue all other medications as before, and refills have been done if requested.  Please have the pharmacy call with any other refills you may need.  Please continue your efforts at being more active, low cholesterol diet, and weight control.  You are otherwise up to date with prevention measures today.  Please keep your appointments with your specialists as you may have planned  Please return in 1 year for your yearly visit, or sooner if needed

## 2017-01-04 NOTE — Progress Notes (Signed)
Agree with assessment and plan as outlined.  

## 2017-01-04 NOTE — Patient Instructions (Signed)
Schedule hyoscyamine at bedtime and in the morning.   Continue IBgard and colestipol.

## 2017-01-07 NOTE — Assessment & Plan Note (Signed)

## 2017-01-07 NOTE — Assessment & Plan Note (Signed)
Ok for increased dose lunesta asd,  to f/u any worsening symptoms or concerns

## 2017-01-18 ENCOUNTER — Ambulatory Visit: Payer: 59 | Admitting: Internal Medicine

## 2017-01-18 ENCOUNTER — Ambulatory Visit: Payer: 59 | Admitting: Physician Assistant

## 2017-01-18 NOTE — Telephone Encounter (Signed)
Done

## 2017-01-26 ENCOUNTER — Encounter: Payer: Self-pay | Admitting: Physician Assistant

## 2017-01-26 MED ORDER — DICYCLOMINE HCL 20 MG PO TABS: 20.0000 mg | ORAL_TABLET | Freq: Two times a day (BID) | ORAL | 2 refills | Status: DC

## 2017-01-26 NOTE — Telephone Encounter (Signed)
We can try switching to Dicyclomine 20mg  BID instead. Tell him good luck at school and he can email me as needed if he continues with problems. Thanks-JLL

## 2017-02-03 ENCOUNTER — Other Ambulatory Visit: Payer: Self-pay | Admitting: Gastroenterology

## 2017-02-03 ENCOUNTER — Encounter: Payer: Self-pay | Admitting: Internal Medicine

## 2017-02-03 MED ORDER — ESZOPICLONE 2 MG PO TABS: 2.0000 mg | ORAL_TABLET | Freq: Every evening | ORAL | 1 refills | Status: DC | PRN

## 2017-02-26 ENCOUNTER — Other Ambulatory Visit: Payer: Self-pay | Admitting: Gastroenterology

## 2017-05-03 ENCOUNTER — Other Ambulatory Visit: Payer: Self-pay | Admitting: Physician Assistant

## 2017-05-30 ENCOUNTER — Other Ambulatory Visit: Payer: Self-pay | Admitting: Physician Assistant

## 2017-08-15 ENCOUNTER — Other Ambulatory Visit: Payer: Self-pay | Admitting: Internal Medicine

## 2017-08-16 NOTE — Telephone Encounter (Signed)
Done erx 

## 2017-09-21 ENCOUNTER — Ambulatory Visit (INDEPENDENT_AMBULATORY_CARE_PROVIDER_SITE_OTHER): Payer: Managed Care, Other (non HMO) | Admitting: Internal Medicine

## 2017-09-21 ENCOUNTER — Encounter: Payer: Self-pay | Admitting: Internal Medicine

## 2017-09-21 VITALS — BP 126/84 | HR 95 | Temp 98.4°F | Ht 65.0 in | Wt 238.0 lb

## 2017-09-21 DIAGNOSIS — Z114 Encounter for screening for human immunodeficiency virus [HIV]: Secondary | ICD-10-CM

## 2017-09-21 DIAGNOSIS — Z0000 Encounter for general adult medical examination without abnormal findings: Secondary | ICD-10-CM | POA: Diagnosis not present

## 2017-09-21 NOTE — Patient Instructions (Signed)

## 2017-09-21 NOTE — Assessment & Plan Note (Signed)

## 2017-09-21 NOTE — Progress Notes (Signed)
Subjective:    Patient ID: Paul Parsons, male    DOB: 1995/02/03, 22 y.o.   MRN: [id number]  HPI  Here for wellness and f/u;  Overall doing ok;  Pt denies Chest pain, worsening SOB, DOE, wheezing, orthopnea, PND, worsening LE edema, palpitations, dizziness or syncope.  Pt denies neurological change such as new headache, facial or extremity weakness.  Pt denies polydipsia, polyuria, or low sugar symptoms. Pt states overall good compliance with treatment and medications, good tolerability, and has been trying to follow appropriate diet.  Pt denies worsening depressive symptoms, suicidal ideation or panic. No fever, night sweats, wt loss, loss of appetite, or other constitutional symptoms.  Pt states good ability with ADL's, has low fall risk, home safety reviewed and adequate, no other significant changes in hearing or vision, and only occasionally active with exercise. Has gained significant wt now in 4th year college, has already lost a few lbs and plans to do better with diet and more acitivity Wt Readings from Last 3 Encounters:  09/21/17 238 lb (108 kg)  01/04/17 213 lb (96.6 kg)  01/04/17 212 lb (96.2 kg)   Past Medical History:  Diagnosis Date  . Abdominal pain, recurrent   . Abdominal pain, recurrent   . Asthma   . Chronic constipation    Very large hard stools  . Chronic constipation   . IBS (irritable bowel syndrome)    Past Surgical History:  Procedure Laterality Date  . CHOLECYSTECTOMY    . ERCP  2012  . MOUTH SURGERY      x 2 , wisdom teeth    reports that he has never smoked. He has never used smokeless tobacco. He reports that he does not drink alcohol or use drugs. family history includes Breast cancer in his mother. Allergies  Allergen Reactions  . Tetracyclines & Related   . Zithromax [Azithromycin Dihydrate]    Current Outpatient Medications on File Prior to Visit  Medication Sig Dispense Refill  . amitriptyline (ELAVIL) 25 MG tablet Take 1 tablet (25 mg  total) by mouth at bedtime. Take 1 tablet qhs for 2 weeks. Then increase to 2 tablets qhs if needed 90 tablet 3  . budesonide-formoterol (SYMBICORT) 160-4.5 MCG/ACT inhaler Inhale 2 puffs into the lungs as needed.     . colestipol (COLESTID) 1 g tablet TAKE 1 TABLET BY MOUTH TWO  TIMES DAILY 180 tablet 0  . dicyclomine (BENTYL) 20 MG tablet TAKE 1 TABLET BY MOUTH TWICE A DAY 60 tablet 0  . eszopiclone (LUNESTA) 2 MG TABS tablet TAKE 1 TABLET (2 MG TOTAL) BY MOUTH AT BEDTIME AS NEEDED FOR SLEEP. TAKE IMMEDIATELY BEFORE BEDTIME 30 tablet 1  . FIBER ADULT GUMMIES PO Take by mouth. Takes two b.i.d    . Hyoscyamine Sulfate SL 0.125 MG SUBL Place 0.125 mg under the tongue every 6 (six) hours as needed. 180 each 1  . Melatonin 10 MG TABS Take 10 mg by mouth daily as needed.     Marland Kitchen PRESCRIPTION MEDICATION Ibgard 1 2x daily     No current facility-administered medications on file prior to visit.    Review of Systems  Constitutional: Negative for other unusual diaphoresis or sweats HENT: Negative for ear discharge or swelling Eyes: Negative for other worsening visual disturbances Respiratory: Negative for stridor or other swelling  Gastrointestinal: Negative for worsening distension or other blood Genitourinary: Negative for retention or other urinary change Musculoskeletal: Negative for other MSK pain or swelling Skin: Negative for color change  or other new lesions Neurological: Negative for worsening tremors and other numbness  Psychiatric/Behavioral: Negative for worsening agitation or other fatigue All other system neg per pt    Objective:   Physical Exam BP 126/84   Pulse 95   Temp 98.4 F (36.9 C) (Oral)   Ht 5\' 5"  (1.651 m)   Wt 238 lb (108 kg)   SpO2 99%   BMI 39.61 kg/m  VS noted,  Constitutional: Pt is oriented to person, place, and time. Appears well-developed and well-nourished, in no significant distress and comfortable Head: Normocephalic and atraumatic  Eyes: Conjunctivae  and EOM are normal. Pupils are equal, round, and reactive to light Right Ear: External ear normal without discharge Left Ear: External ear normal without discharge Nose: Nose without discharge or deformity Mouth/Throat: Oropharynx is without other ulcerations and moist  Neck: Normal range of motion. Neck supple. No JVD present. No tracheal deviation present or significant neck LA or mass Cardiovascular: Normal rate, regular rhythm, normal heart sounds and intact distal pulses.   Pulmonary/Chest: WOB normal and breath sounds without rales or wheezing  Abdominal: Soft. Bowel sounds are normal. NT. No HSM  Musculoskeletal: Normal range of motion. Exhibits no edema Lymphadenopathy: Has no other cervical adenopathy.  Neurological: Pt is alert and oriented to person, place, and time. Pt has normal reflexes. No cranial nerve deficit. Motor grossly intact, Gait intact Skin: Skin is warm and dry. No rash noted or new ulcerations Psychiatric:  Has normal mood and affect. Behavior is normal without agitation No other exam findings . Lab Results  Component Value Date   WBC 7.3 08/21/2015   HGB 15.6 08/21/2015   HCT 48 08/21/2015   PLT 255 08/21/2015   GLUCOSE 81 11/27/2010   CHOL 109 08/21/2015   TRIG 99 08/21/2015   HDL 41 08/21/2015   LDLCALC 48 08/21/2015   ALT 28 08/21/2015   AST 18 08/21/2015   NA 143 08/21/2015   K 4.5 08/21/2015   CL 100 11/27/2010   CREATININE 1.1 08/21/2015   BUN 20 08/21/2015   CO2 24 11/27/2010   TSH 2.070 03/18/2014       Assessment & Plan:

## 2017-09-22 ENCOUNTER — Other Ambulatory Visit: Payer: Self-pay | Admitting: Internal Medicine

## 2017-09-23 LAB — HEPATIC FUNCTION PANEL
ALT: 32 IU/L (ref 0–44)
AST: 21 IU/L (ref 0–40)
Albumin: 5 g/dL (ref 3.5–5.5)
Alkaline Phosphatase: 124 IU/L — ABNORMAL HIGH (ref 39–117)
Bilirubin Total: 0.3 mg/dL (ref 0.0–1.2)
Bilirubin, Direct: 0.12 mg/dL (ref 0.00–0.40)
Total Protein: 7.4 g/dL (ref 6.0–8.5)

## 2017-09-23 LAB — LIPID PANEL W/O CHOL/HDL RATIO
Cholesterol, Total: 153 mg/dL (ref 100–199)
HDL: 50 mg/dL (ref >39)
LDL Calculated: 80 mg/dL (ref 0–99)
Triglycerides: 115 mg/dL (ref 0–149)
VLDL Cholesterol Cal: 23 mg/dL (ref 5–40)

## 2017-09-23 LAB — CBC WITH DIFFERENTIAL/PLATELET
Basophils Absolute: 0 10*3/uL (ref 0.0–0.2)
Basos: 0 %
EOS (ABSOLUTE): 0.6 10*3/uL — ABNORMAL HIGH (ref 0.0–0.4)
Eos: 5 %
Hematocrit: 47.5 % (ref 37.5–51.0)
Hemoglobin: 15.9 g/dL (ref 13.0–17.7)
Immature Grans (Abs): 0 10*3/uL (ref 0.0–0.1)
Immature Granulocytes: 0 %
Lymphocytes Absolute: 4 10*3/uL — ABNORMAL HIGH (ref 0.7–3.1)
Lymphs: 38 %
MCH: 29 pg (ref 26.6–33.0)
MCHC: 33.5 g/dL (ref 31.5–35.7)
MCV: 87 fL (ref 79–97)
Monocytes Absolute: 0.7 10*3/uL (ref 0.1–0.9)
Monocytes: 6 %
Neutrophils Absolute: 5.3 10*3/uL (ref 1.4–7.0)
Neutrophils: 51 %
Platelets: 348 10*3/uL (ref 150–450)
RBC: 5.48 x10E6/uL (ref 4.14–5.80)
RDW: 13.3 % (ref 12.3–15.4)
WBC: 10.6 10*3/uL (ref 3.4–10.8)

## 2017-09-23 LAB — URINALYSIS, ROUTINE W REFLEX MICROSCOPIC
Bilirubin, UA: NEGATIVE
Glucose, UA: NEGATIVE
Ketones, UA: NEGATIVE
Leukocytes, UA: NEGATIVE
Nitrite, UA: NEGATIVE
Protein, UA: NEGATIVE
RBC, UA: NEGATIVE
Specific Gravity, UA: 1.025 (ref 1.005–1.030)
Urobilinogen, Ur: 0.2 mg/dL (ref 0.2–1.0)
pH, UA: 5.5 (ref 5.0–7.5)

## 2017-09-23 LAB — BASIC METABOLIC PANEL
BUN/Creatinine Ratio: 19 (ref 9–20)
BUN: 21 mg/dL — ABNORMAL HIGH (ref 6–20)
CO2: 24 mmol/L (ref 20–29)
Calcium: 10.1 mg/dL (ref 8.7–10.2)
Chloride: 98 mmol/L (ref 96–106)
Creatinine, Ser: 1.11 mg/dL (ref 0.76–1.27)
GFR calc Af Amer: 109 mL/min/{1.73_m2} (ref >59)
GFR calc non Af Amer: 94 mL/min/{1.73_m2} (ref >59)
Glucose: 91 mg/dL (ref 65–99)
Potassium: 4.5 mmol/L (ref 3.5–5.2)
Sodium: 139 mmol/L (ref 134–144)

## 2017-09-23 LAB — HIV ANTIBODY (ROUTINE TESTING W REFLEX): HIV Screen 4th Generation wRfx: NONREACTIVE

## 2017-09-23 LAB — TSH: TSH: 1.71 u[IU]/mL (ref 0.450–4.500)

## 2017-09-28 ENCOUNTER — Ambulatory Visit: Payer: 59 | Admitting: Physician Assistant

## 2017-10-21 ENCOUNTER — Encounter: Payer: Self-pay | Admitting: Internal Medicine

## 2017-10-23 MED ORDER — ESZOPICLONE 2 MG PO TABS: 2.0000 mg | ORAL_TABLET | Freq: Every evening | ORAL | 5 refills | Status: DC | PRN

## 2017-10-23 NOTE — Telephone Encounter (Signed)
Done erx 

## 2017-10-24 ENCOUNTER — Telehealth: Payer: Self-pay | Admitting: Internal Medicine

## 2017-10-24 MED ORDER — ESZOPICLONE 2 MG PO TABS: 2.0000 mg | ORAL_TABLET | Freq: Every evening | ORAL | 5 refills | Status: DC | PRN

## 2017-10-24 NOTE — Telephone Encounter (Signed)
Called pt, LVM to call back to discuss or to contact me via MyChart

## 2017-10-24 NOTE — Telephone Encounter (Signed)
Copied from CRM [id number]. Topic: Referral - Request >> Oct 24, 2017 12:12 PM Luanna Cole wrote: Reason for CRM: pt mother called and stated that patient would like a referral for a sleep study.

## 2017-10-24 NOTE — Telephone Encounter (Signed)
Pt calling to check status. Pharmacy phone number [phone]

## 2017-10-24 NOTE — Telephone Encounter (Signed)
I had not documented at last visit that we had considered this; is this for worsening symptoms or some kind?

## 2017-10-24 NOTE — Telephone Encounter (Signed)
Done erx 

## 2017-10-27 ENCOUNTER — Ambulatory Visit: Payer: Managed Care, Other (non HMO) | Admitting: Physician Assistant

## 2017-12-13 ENCOUNTER — Ambulatory Visit: Payer: Self-pay | Admitting: *Deleted

## 2017-12-13 NOTE — Telephone Encounter (Signed)
Contacted pt's father, Casimiro NeedleMichael because the pt is away at school; he will be home 12/13/17; his father says that the pt takes lunesta 3 mg at night but is still barely getting 3-4 hours of sleep; his father says that the pt has put off coming home for 2 days due to exhaustion; recommendations per nurse triage protocol; Casimiro NeedleMichael accepted appointment on pt behalf 12/18/17 at 1620 with Dr Oliver BarreJames [name], [facility]eker[facility], spoke with [place] in regards to scheduling this appointment; will route to office for notification.    Reason for Disposition . [1] Insomnia persists > 1 week AND [2] no improvement after using CARE ADVICE  Answer Assessment - Initial Assessment Questions 1. DESCRIPTION: "Tell me about your sleeping problem."      Unable to sleep 2. ONSET: "How long have you been having trouble sleeping?" (e.g., days, weeks, months)     2 weeks 3. RECURRENT: "Have you had sleeping problems before?"  If yes: "What happened that time?" "What helped your sleeping problem go away in the past?"    Yes throughout college 4. STRESS: "Is there anything in your life that is making you feel stressed or tense?"     College, studies, and final exams 5. PAIN: "Do you have any pain that is keeping you awake?" (e.g., back pain, headache, abdominal pain)     no 6. CAFFEINE ABUSE: "Do you drink caffeinated beverages, and how much each day?" (e.g., coffee, tea, colas)     Coffee per father 2 cups coffee daily 7. SUBSTANCE ABUSE: "Do you use any illegal drugs or alcohol?"     no 8. OTHER SYMPTOMS: "Do you have any other symptoms?"  (e.g., difficulty breathing)     Agitation due to lack of sleep  Protocols used: INSOMNIA-A-AH

## 2017-12-18 ENCOUNTER — Ambulatory Visit (INDEPENDENT_AMBULATORY_CARE_PROVIDER_SITE_OTHER): Payer: Managed Care, Other (non HMO) | Admitting: Internal Medicine

## 2017-12-18 VITALS — BP 122/84 | HR 114 | Temp 98.8°F | Ht 65.0 in | Wt 240.0 lb

## 2017-12-18 DIAGNOSIS — F5101 Primary insomnia: Secondary | ICD-10-CM | POA: Diagnosis not present

## 2017-12-18 DIAGNOSIS — F419 Anxiety disorder, unspecified: Secondary | ICD-10-CM | POA: Diagnosis not present

## 2017-12-18 DIAGNOSIS — G4710 Hypersomnia, unspecified: Secondary | ICD-10-CM

## 2017-12-18 MED ORDER — CITALOPRAM HYDROBROMIDE 20 MG PO TABS: 20.0000 mg | ORAL_TABLET | Freq: Every day | ORAL | 3 refills | Status: DC

## 2017-12-18 MED ORDER — ZOLPIDEM TARTRATE ER 12.5 MG PO TBCR: 12.5000 mg | EXTENDED_RELEASE_TABLET | Freq: Every evening | ORAL | 1 refills | Status: DC | PRN

## 2017-12-18 NOTE — Assessment & Plan Note (Signed)
Ok to try change to [place] cr 12.5 qhs prn,  to f/u any worsening symptoms or concerns

## 2017-12-18 NOTE — Progress Notes (Signed)
Subjective:    Patient ID: Paul Parsons, male    DOB: 12-Mar-1995, 22 y.o.   MRN: [id number]  HPI   Here with c/o 3 mo acute onset moderate gradually worsening anxiety and sleep, despite the [place] which has worked for several yrs, now not working at all.  Currently finishing senior yr in college, sees counseling every 2 wks. Denies worsening depressive symptoms, suicidal ideation, or panic.  Also with c/o increasing daytime somnolence assoc with poor sleep but has also gained marked wt, not sure if he snores, just keeps trying to fall sleep most days  Pt denies chest pain, increased sob or doe, wheezing, orthopnea, PND, increased LE swelling, palpitations, dizziness or syncope.   Past Medical History:  Diagnosis Date  . Abdominal pain, recurrent   . Abdominal pain, recurrent   . Asthma   . Chronic constipation    Very large hard stools  . Chronic constipation   . IBS (irritable bowel syndrome)    Past Surgical History:  Procedure Laterality Date  . CHOLECYSTECTOMY    . ERCP  2012  . MOUTH SURGERY      x 2 , wisdom teeth    reports that he has never smoked. He has never used smokeless tobacco. He reports that he does not drink alcohol or use drugs. family history includes Breast cancer in his mother. Allergies  Allergen Reactions  . Tetracyclines & Related   . Zithromax [Azithromycin Dihydrate]    Current Outpatient Medications on File Prior to Visit  Medication Sig Dispense Refill  . amitriptyline (ELAVIL) 25 MG tablet Take 1 tablet (25 mg total) by mouth at bedtime. Take 1 tablet qhs for 2 weeks. Then increase to 2 tablets qhs if needed 90 tablet 3  . budesonide-formoterol (SYMBICORT) 160-4.5 MCG/ACT inhaler Inhale 2 puffs into the lungs as needed.     . colestipol (COLESTID) 1 g tablet TAKE 1 TABLET BY MOUTH TWO  TIMES DAILY 180 tablet 0  . dicyclomine (BENTYL) 20 MG tablet TAKE 1 TABLET BY MOUTH TWICE A DAY 60 tablet 0  . eszopiclone (LUNESTA) 2 MG TABS tablet Take 1 tablet  (2 mg total) by mouth at bedtime as needed for sleep. Take immediately before bedtime 30 tablet 5  . FIBER ADULT GUMMIES PO Take by mouth. Takes two b.i.d    . Hyoscyamine Sulfate SL 0.125 MG SUBL Place 0.125 mg under the tongue every 6 (six) hours as needed. 180 each 1  . Melatonin 10 MG TABS Take 10 mg by mouth daily as needed.     Marland Kitchen PRESCRIPTION MEDICATION Ibgard 1 2x daily     No current facility-administered medications on file prior to visit.    Review of Systems  Constitutional: Negative for other unusual diaphoresis or sweats HENT: Negative for ear discharge or swelling Eyes: Negative for other worsening visual disturbances Respiratory: Negative for stridor or other swelling  Gastrointestinal: Negative for worsening distension or other blood Genitourinary: Negative for retention or other urinary change Musculoskeletal: Negative for other MSK pain or swelling Skin: Negative for color change or other new lesions Neurological: Negative for worsening tremors and other numbness  Psychiatric/Behavioral: Negative for worsening agitation or other fatigue All other system neg per pt    Objective:   Physical Exam BP 122/84   Pulse (!) 114   Temp 98.8 F (37.1 C) (Oral)   Ht 5\' 5"  (1.651 m)   Wt 240 lb (108.9 kg)   SpO2 95%   BMI 39.94 kg/m  VS noted,  Constitutional: Pt appears in NAD HENT: Head: NCAT.  Right Ear: External ear normal.  Left Ear: External ear normal.  Eyes: . Pupils are equal, round, and reactive to light. Conjunctivae and EOM are normal Nose: without d/c or deformity Neck: Neck supple. Gross normal ROM Cardiovascular: Normal rate and regular rhythm.   Pulmonary/Chest: Effort normal and breath sounds without rales or wheezing.  Abd:  Soft, NT, ND, + BS, no organomegaly Neurological: Pt is alert. At baseline orientation, motor grossly intact Skin: Skin is warm. No rashes, other new lesions, no LE edema Psychiatric: Pt behavior is normal without agitation ,  1-2+ nervous, not depressed affect  No other exam findings Lab Results  Component Value Date   WBC 10.6 09/22/2017   HGB 15.9 09/22/2017   HCT 47.5 09/22/2017   PLT 348 09/22/2017   GLUCOSE 91 09/22/2017   CHOL 153 09/22/2017   TRIG 115 09/22/2017   HDL 50 09/22/2017   LDLCALC 80 09/22/2017   ALT 32 09/22/2017   AST 21 09/22/2017   NA 139 09/22/2017   K 4.5 09/22/2017   CL 98 09/22/2017   CREATININE 1.11 09/22/2017   BUN 21 (H) 09/22/2017   CO2 24 09/22/2017   TSH 1.710 09/22/2017          Assessment & Plan:

## 2017-12-18 NOTE — Assessment & Plan Note (Signed)
Possible suspicion for OSA - for pulm referral

## 2017-12-18 NOTE — Patient Instructions (Addendum)
Please take all new medication as prescribed- the ambien CR 12.5 mg at bedtime as needed  Please take all new medication as prescribed - the celexa 20 mg per day  You will be contacted regarding the referral for: Pulmonary  Please continue all other medications as before, and refills have been done if requested.  Please have the pharmacy call with any other refills you may need.  Please continue your efforts at being more active, low cholesterol diet, and weight control.  Please keep your appointments with your specialists as you may have planned

## 2017-12-18 NOTE — Assessment & Plan Note (Signed)
Mild to mod, for celexa 20 qd,  to f/u any worsening symptoms or concerns 

## 2017-12-19 ENCOUNTER — Telehealth: Payer: Self-pay

## 2017-12-19 ENCOUNTER — Telehealth: Payer: Self-pay | Admitting: Physician Assistant

## 2017-12-19 NOTE — Telephone Encounter (Signed)
Very sorry, I do not order sleep tests as this is outside my scope of practice; I am not sure of any other assistance I can provide

## 2017-12-19 NOTE — Telephone Encounter (Signed)
The patient has an appt with Pulmonary for a Sleep Consult on 12/29/17. This was just made today so possibly the parent is unaware.

## 2017-12-19 NOTE — Telephone Encounter (Signed)
Copied from CRM [phone]. Topic: General - Other >> Dec 19, 2017 10:22 AM Elliot GaultBell, Tiffany M wrote: Caller name: Wulff,Allison Relation to pt: mother / parent  Call back number: [phone]   Reason for call:  Pulmonary advised patient there unable to see him before the new year but due to insurance and patient deductible pulmonary advised patient to request from PCP orders only for sleep study and they can schedule him before the new year. Patient / mother requesting a follow up call today regarding.

## 2017-12-20 ENCOUNTER — Telehealth: Payer: Self-pay | Admitting: Internal Medicine

## 2017-12-20 NOTE — Telephone Encounter (Signed)
Pt's mother calling in regards to this.  Advised her of Dr. Raphael GibneyJohn's response.  Pt's mother is aware of appt but states that they need the test done this year not just the consult and Pulmonary can't guarantee that can be done.  Pt's mother wants to know of anywhere else that may be able to do the actual study this year. Revonda Standardllison can be reached at [phone].

## 2017-12-20 NOTE — Telephone Encounter (Signed)
-----   Message from Paul MornMary K Phillips sent at 12/20/2017  2:46 PM EST ----- Dr Jonny RuizJohn you could put a referral in for [facility] but cant guarantee that they could get pt in before end of year. Pt has an appt with pulmonary for dec 13.

## 2017-12-20 NOTE — Telephone Encounter (Signed)
I am not sure, and I dont handle scheduling, though we do try to take this into account with referrals  I will ask PCC's for assist with the question    To mary and lori - can a referral for another provider result in a sleep study before Dec 31?  thanks

## 2017-12-20 NOTE — Telephone Encounter (Signed)
As this is outside the scope of my practice, please clarify what speciality is required to order this referral or study (not sure which you are referring)

## 2017-12-20 NOTE — Telephone Encounter (Signed)
The pt would like a letter stating he has IBS and may need special consideration for classes.  He was suppose to f/u in 6 months (no showed appt) but has not been seen since 12/18.  Do you want him seen prior to providing any letter?

## 2017-12-21 ENCOUNTER — Telehealth: Payer: Self-pay | Admitting: Internal Medicine

## 2017-12-21 DIAGNOSIS — G4710 Hypersomnia, unspecified: Secondary | ICD-10-CM

## 2017-12-21 NOTE — Telephone Encounter (Signed)
Ok this is done 

## 2017-12-21 NOTE — Telephone Encounter (Signed)
-----   Message from Janee MornMary K Phillips sent at 12/21/2017  8:32 AM EST ----- The referral would need to be a Neurology referral  for [facility] study.

## 2017-12-22 NOTE — Telephone Encounter (Signed)
No, I know him quite well. It is ok to give him a letter. Thanks-JLL

## 2017-12-22 NOTE — Telephone Encounter (Signed)
The letter was sent to the pt via My Chart.

## 2017-12-29 ENCOUNTER — Institutional Professional Consult (permissible substitution): Payer: Managed Care, Other (non HMO) | Admitting: Pulmonary Disease

## 2018-01-01 ENCOUNTER — Ambulatory Visit (INDEPENDENT_AMBULATORY_CARE_PROVIDER_SITE_OTHER): Payer: Managed Care, Other (non HMO) | Admitting: Physician Assistant

## 2018-01-01 ENCOUNTER — Encounter: Payer: Self-pay | Admitting: Physician Assistant

## 2018-01-01 ENCOUNTER — Encounter: Payer: Self-pay | Admitting: Internal Medicine

## 2018-01-01 VITALS — BP 116/70 | HR 88 | Ht 65.0 in | Wt 238.4 lb

## 2018-01-01 DIAGNOSIS — K580 Irritable bowel syndrome with diarrhea: Secondary | ICD-10-CM | POA: Diagnosis not present

## 2018-01-01 MED ORDER — HYOSCYAMINE SULFATE SL 0.125 MG SL SUBL: 0.1250 mg | SUBLINGUAL_TABLET | Freq: Four times a day (QID) | SUBLINGUAL | 5 refills | Status: DC | PRN

## 2018-01-01 NOTE — Progress Notes (Signed)
Agree with assessment and plan as outlined.  

## 2018-01-01 NOTE — Progress Notes (Signed)
Chief Complaint: Follow up IBS-D  HPI:    Paul Parsons is a 22 year old male with a past medical history as listed below, assigned to Dr. Adela Lank, with a history of IBS-D, who presents clinic today for follow-up.      01/04/2017 office visit with me for follow-up of IBS-D.  At that time, doing well with FD guard 1 tab twice a day and Colestid 1 g twice daily as well as Hyoscyamine as needed.    Today, the patient tells me that he has been doing very well, in fact he hardly ever has diarrhea anymore and has discontinued his Colestid.  He does use Hyoscyamine as needed for times of increased anxiety or stress which leads to abdominal discomfort.  He does request a refill of this today.  Patient also continues his FD guard 1 tab twice a day and again tells me that this is "magic".  Most recently the patient was started on Citalopram by his primary care provider and tells me this has caused some increased anxiety and stomach issues recently, but he believes this is all a side effect from this medicine.  He has already communicated with his PCP in regards to discontinuing this.    Social history positive for graduating in May of next year, plus one summer course, plans to go for his masters in counseling.    Denies fever, chills, weight loss, anorexia, nausea, vomiting, heartburn, reflux or symptoms that awaken him from sleep.  Past Medical History:  Diagnosis Date  . Abdominal pain, recurrent   . Abdominal pain, recurrent   . Asthma   . Chronic constipation    Very large hard stools  . Chronic constipation   . IBS (irritable bowel syndrome)     Past Surgical History:  Procedure Laterality Date  . CHOLECYSTECTOMY    . ERCP  2012  . MOUTH SURGERY      x 2 , wisdom teeth    Current Outpatient Medications  Medication Sig Dispense Refill  . budesonide-formoterol (SYMBICORT) 160-4.5 MCG/ACT inhaler Inhale 2 puffs into the lungs as needed.     . citalopram (CELEXA) 20 MG tablet Take 1  tablet (20 mg total) by mouth daily. 90 tablet 3  . Melatonin 10 MG TABS Take 10 mg by mouth daily as needed.     Marland Kitchen PRESCRIPTION MEDICATION Ibgard 1 2x daily    . zolpidem (AMBIEN CR) 12.5 MG CR tablet Take 1 tablet (12.5 mg total) by mouth at bedtime as needed for sleep. 90 tablet 1   No current facility-administered medications for this visit.     Allergies as of 01/01/2018 - Review Complete 01/01/2018  Allergen Reaction Noted  . Tetracyclines & related  11/10/2010  . Zithromax [azithromycin dihydrate]  11/10/2010    Family History  Problem Relation Age of Onset  . Breast cancer Mother        BREAST  . Alcohol abuse Neg Hx   . Diabetes Neg Hx   . Drug abuse Neg Hx   . Early death Neg Hx   . Heart disease Neg Hx   . Hyperlipidemia Neg Hx   . Hypertension Neg Hx   . Kidney disease Neg Hx   . Stroke Neg Hx   . Colon cancer Neg Hx   . Stomach cancer Neg Hx     Social History   Socioeconomic History  . Marital status: Single    Spouse name: Not on file  . Number of children: 0  .  Years of education: Not on file  . Highest education level: Not on file  Occupational History  . Occupation: Consulting civil engineer  Social Needs  . Financial resource strain: Not on file  . Food insecurity:    Worry: Not on file    Inability: Not on file  . Transportation needs:    Medical: Not on file    Non-medical: Not on file  Tobacco Use  . Smoking status: Never Smoker  . Smokeless tobacco: Never Used  Substance and Sexual Activity  . Alcohol use: No    Alcohol/week: 3.0 standard drinks    Types: 3 Cans of beer per week  . Drug use: No    Types: Marijuana    Comment: former  . Sexual activity: Not Currently  Lifestyle  . Physical activity:    Days per week: Not on file    Minutes per session: Not on file  . Stress: Not on file  Relationships  . Social connections:    Talks on phone: Not on file    Gets together: Not on file    Attends religious service: Not on file    Active member  of club or organization: Not on file    Attends meetings of clubs or organizations: Not on file    Relationship status: Not on file  . Intimate partner violence:    Fear of current or ex partner: Not on file    Emotionally abused: Not on file    Physically abused: Not on file    Forced sexual activity: Not on file  Other Topics Concern  . Not on file  Social History Narrative  . Not on file    Review of Systems:    Constitutional: No weight loss, fever or chills Cardiovascular: No chest pain  Respiratory: No SOB  Gastrointestinal: See HPI and otherwise negative   Physical Exam:  Vital signs: BP 116/70 (BP Location: Left Arm, Patient Position: Sitting, Cuff Size: Normal)   Pulse 88   Ht 5\' 5"  (1.651 m) Comment: height measured without shoes  Wt 238 lb 6 oz (108.1 kg)   BMI 39.67 kg/m   Constitutional:   Pleasant overweight Caucasian male appears to be in NAD, Well developed, Well nourished, alert and cooperative Respiratory: Respirations even and unlabored. Lungs clear to auscultation bilaterally.   No wheezes, crackles, or rhonchi.  Cardiovascular: Normal S1, S2. No MRG. Regular rate and rhythm. No peripheral edema, cyanosis or pallor.  Gastrointestinal:  Soft, nondistended, nontender. No rebound or guarding. Normal bowel sounds. No appreciable masses or hepatomegaly. Rectal:  Not performed.  Psychiatric: Demonstrates good judgement and reason without abnormal affect or behaviors.  MOST RECENT LABS AND IMAGING: CBC    Component Value Date/Time   WBC 10.6 09/22/2017 1151   WBC 9.0 06/24/2015 1530   RBC 5.48 09/22/2017 1151   RBC 5.47 06/24/2015 1530   HGB 15.9 09/22/2017 1151   HCT 47.5 09/22/2017 1151   PLT 348 09/22/2017 1151   MCV 87 09/22/2017 1151   MCH 29.0 09/22/2017 1151   MCH 30.2 11/27/2010 1825   MCHC 33.5 09/22/2017 1151   MCHC 34.3 06/24/2015 1530   RDW 13.3 09/22/2017 1151   LYMPHSABS 4.0 (H) 09/22/2017 1151   MONOABS 0.6 06/24/2015 1530   EOSABS  0.6 (H) 09/22/2017 1151   BASOSABS 0.0 09/22/2017 1151    CMP     Component Value Date/Time   NA 139 09/22/2017 1151   K 4.5 09/22/2017 1151   CL 98 09/22/2017  1151   CO2 24 09/22/2017 1151   GLUCOSE 91 09/22/2017 1151   GLUCOSE 81 11/27/2010 1825   BUN 21 (H) 09/22/2017 1151   CREATININE 1.11 09/22/2017 1151   CALCIUM 10.1 09/22/2017 1151   PROT 7.4 09/22/2017 1151   ALBUMIN 5.0 09/22/2017 1151   AST 21 09/22/2017 1151   ALT 32 09/22/2017 1151   ALKPHOS 124 (H) 09/22/2017 1151   BILITOT 0.3 09/22/2017 1151   GFRNONAA 94 09/22/2017 1151   GFRAA 109 09/22/2017 1151    Assessment: 1.  IBS-D: No trouble with diarrhea any longer, doing well maintaining his symptoms with FD guard 1 tab twice daily as well as hyoscyamine as needed  Plan: 1.  Refilled Hyoscyamine 0.125 mg sublingual tabs every 12 hours as needed #60 with 5 refills 2.  Continue FD guard 1 tab twice daily, provided him with coupons. 3.  Patient to follow in clinic with Dr. Adela LankArmbruster or myself in a year for refills or sooner if necessary.  Paul MeekerJennifer Araina Butrick, PA-C [facility] 01/01/2018, 11:08 AM  Cc: Paul Parsons, Paul Parsons, Paul Parsons

## 2018-01-01 NOTE — Addendum Note (Signed)
Addended by: Zachary GeorgeMORAYATI, GLORIA G on: 01/01/2018 11:18 AM   Modules accepted: Orders

## 2018-01-01 NOTE — Patient Instructions (Signed)
If you are age 21 or older, your body mass index should be between 23-30. Your Body mass index is 39.67 kg/m. If this is out of the aforementioned range listed, please consider follow up with your Primary Care Provider.  If you are age 21 or younger, your body mass index should be between 19-25. Your Body mass index is 39.67 kg/m. If this is out of the aformentioned range listed, please consider follow up with your Primary Care Provider.   We have sent the following medications to your pharmacy for you to pick up at your convenience: Hyoscyamine  You have been given samples of FDgard.  Follow up with me in one year.  Thank you for choosing me and [facility].   Willette ClusterPaula Guenther, NP

## 2018-01-22 ENCOUNTER — Encounter: Payer: Self-pay | Admitting: Pulmonary Disease

## 2018-01-22 ENCOUNTER — Other Ambulatory Visit: Payer: Self-pay | Admitting: Pulmonary Disease

## 2018-01-22 ENCOUNTER — Ambulatory Visit (INDEPENDENT_AMBULATORY_CARE_PROVIDER_SITE_OTHER): Payer: BLUE CROSS/BLUE SHIELD | Admitting: Pulmonary Disease

## 2018-01-22 VITALS — BP 106/72 | HR 67 | Ht 65.0 in | Wt 235.0 lb

## 2018-01-22 DIAGNOSIS — F5104 Psychophysiologic insomnia: Secondary | ICD-10-CM | POA: Diagnosis not present

## 2018-01-22 MED ORDER — ZOLPIDEM TARTRATE ER 12.5 MG PO TBCR: 12.5000 mg | EXTENDED_RELEASE_TABLET | Freq: Every evening | ORAL | 1 refills | Status: DC | PRN

## 2018-01-22 NOTE — Progress Notes (Signed)
Paul Parsons    [id number]    1995-03-06  Primary Care Physician:[name], Len BlalockJames W, MD  Referring Physician: Corwin LevinsJohn, [name], MD [address] [place] [place], [place] [id number]  Chief complaint:   Patient with chronic insomnia  HPI:  Patient has been on Lunesta for many years about 3 to 4 years, was recently switched to Ambien Symptoms have remarkably improved with use of Ambien Sleeping better Able to fall asleep easier and able to maintain sleep with Ambien use  Wakes up feeling like he is at a decent rest with current medication  His insomnia has been chronic, multiple other interventions did not help in the past  Denies snoring, no witnessed apneas  Has gained about 40 pounds recently-over the last year, he is working on weight loss at present  Dad and a sibling have significant snoring  He does have issues with anxiety  Outpatient Encounter Medications as of 01/22/2018  Medication Sig  . budesonide-formoterol (SYMBICORT) 160-4.5 MCG/ACT inhaler Inhale 2 puffs into the lungs as needed.   Marland Kitchen. Hyoscyamine Sulfate SL 0.125 MG SUBL Place 0.125 mg under the tongue every 6 (six) hours as needed.  . Melatonin 10 MG TABS Take 10 mg by mouth daily as needed.   Marland Kitchen. PRESCRIPTION MEDICATION Ibgard 1 2x daily  . zolpidem (AMBIEN CR) 12.5 MG CR tablet Take 1 tablet (12.5 mg total) by mouth at bedtime as needed for sleep.  . [DISCONTINUED] citalopram (CELEXA) 20 MG tablet Take 1 tablet (20 mg total) by mouth daily.   No facility-administered encounter medications on file as of 01/22/2018.     Allergies as of 01/22/2018 - Review Complete 01/22/2018  Allergen Reaction Noted  . Tetracyclines & related  11/10/2010  . Zithromax [azithromycin dihydrate]  11/10/2010    Past Medical History:  Diagnosis Date  . Abdominal pain, recurrent   . Abdominal pain, recurrent   . Asthma   . Chronic constipation    Very large hard stools  . Chronic constipation   . IBS (irritable bowel  syndrome)     Past Surgical History:  Procedure Laterality Date  . CHOLECYSTECTOMY    . ERCP  2012  . MOUTH SURGERY      x 2 , wisdom teeth    Family History  Problem Relation Age of Onset  . Breast cancer Mother        BREAST  . Alcohol abuse Neg Hx   . Diabetes Neg Hx   . Drug abuse Neg Hx   . Early death Neg Hx   . Heart disease Neg Hx   . Hyperlipidemia Neg Hx   . Hypertension Neg Hx   . Kidney disease Neg Hx   . Stroke Neg Hx   . Colon cancer Neg Hx   . Stomach cancer Neg Hx     Social History   Socioeconomic History  . Marital status: Single    Spouse name: Not on file  . Number of children: 0  . Years of education: Not on file  . Highest education level: Not on file  Occupational History  . Occupation: Consulting civil engineerstudent  Social Needs  . Financial resource strain: Not on file  . Food insecurity:    Worry: Not on file    Inability: Not on file  . Transportation needs:    Medical: Not on file    Non-medical: Not on file  Tobacco Use  . Smoking status: Never Smoker  .  Smokeless tobacco: Never Used  Substance and Sexual Activity  . Alcohol use: No    Alcohol/week: 3.0 standard drinks    Types: 3 Cans of beer per week  . Drug use: No    Types: Marijuana    Comment: former  . Sexual activity: Not Currently  Lifestyle  . Physical activity:    Days per week: Not on file    Minutes per session: Not on file  . Stress: Not on file  Relationships  . Social connections:    Talks on phone: Not on file    Gets together: Not on file    Attends religious service: Not on file    Active member of club or organization: Not on file    Attends meetings of clubs or organizations: Not on file    Relationship status: Not on file  . Intimate partner violence:    Fear of current or ex partner: Not on file    Emotionally abused: Not on file    Physically abused: Not on file    Forced sexual activity: Not on file  Other Topics Concern  . Not on file  Social History  Narrative  . Not on file    Review of Systems  Constitutional: Negative.  Negative for activity change and fatigue.  HENT: Negative.   Eyes: Negative.   Respiratory: Negative.  Negative for apnea.   Cardiovascular: Negative.   Gastrointestinal: Negative.   Psychiatric/Behavioral: Positive for sleep disturbance.    Vitals:   01/22/18 1514  BP: 106/72  Pulse: 67  SpO2: 97%     Physical Exam  Constitutional: He appears well-developed and well-nourished.  Obese  HENT:  Head: Normocephalic and atraumatic.  Eyes: Pupils are equal, round, and reactive to light. Conjunctivae and EOM are normal. Right eye exhibits no discharge. Left eye exhibits no discharge.  Neck: Normal range of motion. Neck supple. No tracheal deviation present. No thyromegaly present.  Cardiovascular: Normal rate and regular rhythm.  Pulmonary/Chest: Effort normal and breath sounds normal. No respiratory distress. He has no wheezes.  Abdominal: Soft. Bowel sounds are normal. He exhibits no distension. There is no abdominal tenderness.  Musculoskeletal: Normal range of motion.        General: No edema.   Data Reviewed: Records reviewed  Assessment:   Sleep onset and sleep maintenance insomnia  Obesity  Has no significant concerns regarding obstructive sleep apnea  Mild daytime sleepiness may be related to inadequate sleep  Plan/Recommendations:  Continue Ambien use for insomnia  Discussed skipping doses occasionally with the general goal still being to control symptoms   No changes need to be made to Ambien at the present time  Encouraged patient about weight loss efforts  We will see him back in the office in about 6 months  enouraged to call if any significant changes in symptoms  Polysomnogram can be considered if his hypersomnolence is out of context with his diagnosis of insomnia and he has symptoms suggesting presence of sleep disordered breathing  Paul Diamond MD [facility] 01/22/2018, 3:18 PM  CC: Paul Levins, MD

## 2018-01-22 NOTE — Patient Instructions (Signed)
Chronic insomnia Ambien seems to be working well at present-continue with Ambien use If able, give yourself breaks from regular use--General goal is still to have your symptoms controlled  I will see you back in the office in about 6 months I encourage you to continue to work on weight loss efforts  If your symptoms change, we will be glad to see you sooner Call with any concerns

## 2018-02-14 ENCOUNTER — Telehealth: Payer: Self-pay | Admitting: Pulmonary Disease

## 2018-02-14 NOTE — Telephone Encounter (Signed)
Call made to patient, patient states he called his insurance and he states the generic version of Ambien is what is covered.   OA please advise once Ambien has been sent to pharmacy as we can no longer print them. Patient aware this will addressed tomorrow.

## 2018-02-15 ENCOUNTER — Telehealth: Payer: Self-pay | Admitting: Pulmonary Disease

## 2018-02-15 ENCOUNTER — Other Ambulatory Visit: Payer: Self-pay | Admitting: Pulmonary Disease

## 2018-02-15 MED ORDER — ZOLPIDEM TARTRATE 10 MG PO TABS: 10.0000 mg | ORAL_TABLET | Freq: Every evening | ORAL | 3 refills | Status: DC | PRN

## 2018-02-15 NOTE — Telephone Encounter (Signed)
Ambien 10mg , prescription sent to wrong pharmacy ([facility] Needs to be [facility] to [facility]. [facility], prescription not picked up. [facility] prescription.  New Prescription needs to be sent to [facility].  Dr Wynona Neat, please advise

## 2018-02-15 NOTE — Telephone Encounter (Signed)
Called and left message with Patients Mother, Paul Parsons.  Patients Ambien was sent by Dr Wynona Neat, [facility] [place]. Nothing further at this time.

## 2018-02-15 NOTE — Telephone Encounter (Signed)
Ambien order sent to pharmacy

## 2018-02-15 NOTE — Progress Notes (Unsigned)
a 

## 2018-02-15 NOTE — Telephone Encounter (Signed)
Ambien prescription sent to [facility] per Dr Wynona Neat.  Patient aware.  Nothing further at this time.

## 2018-02-15 NOTE — Telephone Encounter (Signed)
Called and left detailed message on VM,(DPR), to let Patient know that Prescription had been sen to pharmacy.  Nothing further at this time.

## 2018-02-15 NOTE — Telephone Encounter (Signed)
Order sent to [facility]

## 2018-05-02 ENCOUNTER — Other Ambulatory Visit: Payer: Self-pay | Admitting: Physician Assistant

## 2018-06-12 ENCOUNTER — Other Ambulatory Visit: Payer: Self-pay | Admitting: Pulmonary Disease

## 2018-06-13 ENCOUNTER — Telehealth: Payer: Self-pay | Admitting: Pulmonary Disease

## 2018-06-13 MED ORDER — ZOLPIDEM TARTRATE 10 MG PO TABS: 10.0000 mg | ORAL_TABLET | Freq: Every evening | ORAL | 0 refills | Status: DC | PRN

## 2018-06-13 NOTE — Telephone Encounter (Signed)
Is this appropriate for refill ?  Last fill date 05/14/2018.

## 2018-06-13 NOTE — Telephone Encounter (Signed)
Spoke with pt. He is aware that we will refill his medication. Rx has been called in. Nothing further was needed.

## 2018-06-13 NOTE — Telephone Encounter (Signed)
OK to refill x 30 days. Next refill needs to go to Dr. Wynona Neat. Thanks

## 2018-09-10 ENCOUNTER — Telehealth: Payer: Self-pay | Admitting: Pulmonary Disease

## 2018-09-10 ENCOUNTER — Encounter: Payer: Self-pay | Admitting: Pulmonary Disease

## 2018-09-10 DIAGNOSIS — F5104 Psychophysiologic insomnia: Secondary | ICD-10-CM

## 2018-09-10 MED ORDER — ZOLPIDEM TARTRATE 10 MG PO TABS: 10.0000 mg | ORAL_TABLET | Freq: Every evening | ORAL | 0 refills | Status: DC | PRN

## 2018-09-10 NOTE — Telephone Encounter (Signed)
Called and spoke with Patient.  Patient made aware that Aaron Edelman, NP has sent requested prescription electronically.   Patient stated he would call back at another time and schedule follow up with Dr Ander Slade. Nothing further at this time.

## 2018-09-10 NOTE — Telephone Encounter (Signed)
Call returned to patient, requesting refill of Ambien.   Last filled: 06/13/2018 #30, not to exceed 4 refills prior to 08/14/2018.   B Mack, please advise. Order has been pended. Thanks. (Double check refills)

## 2018-09-10 NOTE — Telephone Encounter (Signed)
09/10/2018 1418  Patient's medication have been sent electronically.  Patient is due for a office visit with Dr. Ander Slade.  Please schedule.  Wyn Quaker FNP

## 2018-09-11 ENCOUNTER — Telehealth: Payer: Self-pay | Admitting: Pulmonary Disease

## 2018-09-11 DIAGNOSIS — F5104 Psychophysiologic insomnia: Secondary | ICD-10-CM

## 2018-09-11 MED ORDER — ZOLPIDEM TARTRATE 10 MG PO TABS: 10.0000 mg | ORAL_TABLET | Freq: Every evening | ORAL | 0 refills | Status: DC | PRN

## 2018-09-11 NOTE — Telephone Encounter (Signed)
I noticed that, too. I called the pharmacy again and spoke with the same tech to see if we could coordinate a trouble shooting issue. Pharmacy tech stated that is an issue I would have to take up with corporate and inquired if there was a way we could resend the order. I let her know I would reach out to the provider again. She expressed understanding.   I spoke w/ BPM regarding the situation. BPM stated to cancel the original order placed 09/10/2018, to reorder, and pend the order for him to sign. I have pended the order for Ambien 10 mg 30 tablets x0 refills. Aaron Edelman, please advise. After signature, I will call pt and schedule a follow-up with AO. Thank you.

## 2018-09-11 NOTE — Telephone Encounter (Signed)
Called and spoke w/ pt. Pt states his Ambien 10 mg was supposed to be filled by BPM yesterday (see telephone note from 09/10/2018); however, his pharmacy told him today that they never received the refill order. Pt states he is anxious because he is now completely out of his medication. I let him know I would call the pharmacy to verify if they received the order or not. Pt expressed understanding.   I called and spoke with a pharmacy tech at [facility]. Pharmacy tech verified that they did not have an order to refill Ambien 10 mg in their records.   Called and spoke w/ pt again to let him know the pharmacy did not receive the request and that I would be reaching out to [facility] to get this taken care of. Pt expressed understanding.   Routing to [facility] for f/u. Aaron Edelman, please advise on the refill of this medication. Thank you.

## 2018-09-11 NOTE — Telephone Encounter (Signed)
I see that it was sent. Its on his med list. Please coordinate with pharmacy to trouble shoot the issue. Was it sent the correct pharmacy?  Wyn Quaker FNP

## 2018-09-11 NOTE — Telephone Encounter (Signed)
Not sure what happened. Will resend for patient.   Wyn Quaker FNP

## 2018-09-11 NOTE — Telephone Encounter (Signed)
Called and spoke w/ pharmacy to confirm this order has been received. Pharmacy tech Lysbeth Galas confirmed Ambien 10 mg has been received and that pt will be able to pick up the medication in about 1 hour.   I then called and spoke w/ pt regarding this. Pt verbalized understanding and agreed to setting up f/u appt for 09/20/2018 at 3:00 PM EDT. Appt has been scheduled. Nothing further needed at this time.

## 2018-09-20 ENCOUNTER — Other Ambulatory Visit: Payer: Self-pay

## 2018-09-20 ENCOUNTER — Ambulatory Visit: Payer: BC Managed Care – PPO | Admitting: Pulmonary Disease

## 2018-09-20 ENCOUNTER — Encounter: Payer: Self-pay | Admitting: Pulmonary Disease

## 2018-09-20 VITALS — BP 116/70 | HR 68 | Temp 98.5°F | Ht 65.0 in | Wt 245.8 lb

## 2018-09-20 DIAGNOSIS — F5104 Psychophysiologic insomnia: Secondary | ICD-10-CM | POA: Diagnosis not present

## 2018-09-20 NOTE — Patient Instructions (Addendum)
Chronic insomnia-Ambien continues to help  Continue Ambien Give yourself breaks here and there  You can call us anytime when you are able to get a home sleep study performed  Continue with behavioral modifications Risk of significant sleep apnea will increase if you continue to gain weight  We will see you back in a year

## 2018-09-20 NOTE — Progress Notes (Signed)
Paul Parsons    [id number]    Jan 07, 1996  Primary Care Physician:[name], Len BlalockJames W, MD  Referring Physician: Corwin LevinsJohn, [name], MD [address] [place] [place] [id number]  Chief complaint:   Patient with chronic insomnia Symptoms are improved with Ambien  HPI: Ambien has been helping Skips a dose here and there Sleeping better  Able to fall asleep easier and able to maintain sleep with Ambien use  Wakes up feeling like he is at a decent rest with current medication  His insomnia has been chronic, multiple other interventions did not help in the past  Denies snoring, no witnessed apneas  Has gained about 40 pounds recently-over the last year, continues to gain some weight with the activity limitation around us  Dad and a sibling have significant snoring, dad with OSA  He does have issues with anxiety  Outpatient Encounter Medications as of 09/20/2018  Medication Sig  . budesonide-formoterol (SYMBICORT) 160-4.5 MCG/ACT inhaler Inhale 2 puffs into the lungs as needed.   . hyoscyamine (LEVSIN SL) 0.125 MG SL tablet Take 1 tablet (0.125 mg total) by mouth every 12 (twelve) hours as needed.  . Melatonin 10 MG TABS Take 10 mg by mouth daily as needed.   Marland Kitchen. PRESCRIPTION MEDICATION Ibgard 1 2x daily  . zolpidem (AMBIEN) 10 MG tablet Take 1 tablet (10 mg total) by mouth at bedtime as needed for sleep.   No facility-administered encounter medications on file as of 09/20/2018.     Allergies as of 09/20/2018 - Review Complete 09/20/2018  Allergen Reaction Noted  . Tetracyclines & related  11/10/2010  . Zithromax [azithromycin dihydrate]  11/10/2010    Past Medical History:  Diagnosis Date  . Abdominal pain, recurrent   . Abdominal pain, recurrent   . Asthma   . Chronic constipation    Very large hard stools  . Chronic constipation   . IBS (irritable bowel syndrome)     Past Surgical History:  Procedure Laterality Date  . CHOLECYSTECTOMY    . ERCP  2012  .  MOUTH SURGERY      x 2 , wisdom teeth    Family History  Problem Relation Age of Onset  . Breast cancer Mother        BREAST  . Alcohol abuse Neg Hx   . Diabetes Neg Hx   . Drug abuse Neg Hx   . Early death Neg Hx   . Heart disease Neg Hx   . Hyperlipidemia Neg Hx   . Hypertension Neg Hx   . Kidney disease Neg Hx   . Stroke Neg Hx   . Colon cancer Neg Hx   . Stomach cancer Neg Hx     Social History   Socioeconomic History  . Marital status: Single    Spouse name: Not on file  . Number of children: 0  . Years of education: Not on file  . Highest education level: Not on file  Occupational History  . Occupation: Consulting civil engineerstudent  Social Needs  . Financial resource strain: Not on file  . Food insecurity    Worry: Not on file    Inability: Not on file  . Transportation needs    Medical: Not on file    Non-medical: Not on file  Tobacco Use  . Smoking status: Never Smoker  . Smokeless tobacco: Never Used  Substance and Sexual Activity  . Alcohol use: No    Alcohol/week: 3.0 standard  drinks    Types: 3 Cans of beer per week  . Drug use: No    Types: Marijuana    Comment: former  . Sexual activity: Not Currently  Lifestyle  . Physical activity    Days per week: Not on file    Minutes per session: Not on file  . Stress: Not on file  Relationships  . Social Herbalist on phone: Not on file    Gets together: Not on file    Attends religious service: Not on file    Active member of club or organization: Not on file    Attends meetings of clubs or organizations: Not on file    Relationship status: Not on file  . Intimate partner violence    Fear of current or ex partner: Not on file    Emotionally abused: Not on file    Physically abused: Not on file    Forced sexual activity: Not on file  Other Topics Concern  . Not on file  Social History Narrative  . Not on file    Review of Systems  Constitutional: Negative.  Negative for activity change and fatigue.   HENT: Negative.   Eyes: Negative.   Respiratory: Negative.  Negative for apnea.   Cardiovascular: Negative.   Gastrointestinal: Negative.   Psychiatric/Behavioral: Positive for sleep disturbance.    Vitals:   09/20/18 1508  BP: 116/70  Pulse: 68  Temp: 98.5 F (36.9 C)  SpO2: 96%     Physical Exam  Constitutional: He appears well-developed and well-nourished.  Obese  HENT:  Head: Normocephalic and atraumatic.  Eyes: Pupils are equal, round, and reactive to light. Conjunctivae and EOM are normal. Right eye exhibits no discharge. Left eye exhibits no discharge.  Neck: Normal range of motion. Neck supple. No tracheal deviation present. No thyromegaly present.  Cardiovascular: Normal rate and regular rhythm.  Pulmonary/Chest: Effort normal and breath sounds normal. No respiratory distress. He has no wheezes.  Abdominal: Soft. Bowel sounds are normal. He exhibits no distension. There is no abdominal tenderness.   Data Reviewed: Records reviewed  Assessment:   Sleep onset and sleep maintenance insomnia - Improved symptoms with Ambien  Obesity -Increased weight gain  -encouraged to put in behavioral changes   We did talk about possibility of developing significant obstructive sleep apnea with increasing weight  Plan/Recommendations:  Continue Ambien use for insomnia  Discussed skipping doses occasionally with the general goal still being to control symptoms   No changes to medications at present  Encouraged patient about weight loss efforts  We will see him back in a year  Polysomnogram can be considered if he continues to gain weight, develops daytime symptoms of sleepiness, witnessed apneas, increased snoring-possibility of this with increasing weight gain  Sherrilyn Rist MD [facility] 09/20/2018, 3:20 PM  CC: Biagio Borg, MD

## 2018-10-10 ENCOUNTER — Other Ambulatory Visit: Payer: Self-pay | Admitting: Pulmonary Disease

## 2018-10-10 DIAGNOSIS — F5104 Psychophysiologic insomnia: Secondary | ICD-10-CM

## 2018-10-11 ENCOUNTER — Telehealth: Payer: Self-pay | Admitting: Pulmonary Disease

## 2018-10-11 NOTE — Telephone Encounter (Signed)
Appropriate for refill

## 2018-10-11 NOTE — Telephone Encounter (Signed)
Pt requesting a refill on Ambien 10 mg at [facility]. Refill already authorized by Wyn Quaker. Pt notified rx sent Nothing further needed.     09/20/18 Plan/Recommendations:  Continue Ambien use for insomnia  Discussed skipping doses occasionally with the general goal still being to control symptoms   No changes to medications at present  Encouraged patient about weight loss efforts  We will see him back in a year  Polysomnogram can be considered if he continues to gain weight, develops daytime symptoms of sleepiness, witnessed apneas, increased snoring-possibility of this with increasing weight gain  Sherrilyn Rist MD [facility] 09/20/2018, 3:20 PM

## 2018-10-11 NOTE — Telephone Encounter (Signed)
Refill sent in.   Before prescribing the patient with Ambien, I have checked [place] PMP aware and the patients overdose risk score is 160. Patient has 4 providers prescribing controlled substances. Patient has used 2 pharmacies.   Triage,  Please counseled the patient on the sedative effects of Ambien. Patient to use this medication sparingly and not when driving, drinking alcohol, or using additional sedative medications. Patient has been prescribed 30 tablets with no refills.   Wyn Quaker FNP

## 2018-11-08 ENCOUNTER — Other Ambulatory Visit: Payer: Self-pay | Admitting: Pulmonary Disease

## 2018-11-08 DIAGNOSIS — F5104 Psychophysiologic insomnia: Secondary | ICD-10-CM

## 2018-11-09 ENCOUNTER — Telehealth: Payer: Self-pay | Admitting: Pulmonary Disease

## 2018-11-09 ENCOUNTER — Other Ambulatory Visit: Payer: Self-pay

## 2018-11-09 DIAGNOSIS — F5104 Psychophysiologic insomnia: Secondary | ICD-10-CM

## 2018-11-09 NOTE — Telephone Encounter (Signed)
Please route to patient's primary pulmonologist/sleep doctor as this is a controlled substance.  It will be refilled when that provider is back in office. Per our office protocol. Please look On-Q [place] and see when that date is to you can provide that information to the patient.  Please route to Dr. Josue Hector, FNP

## 2018-11-09 NOTE — Telephone Encounter (Signed)
Patient called requesting a refill for Ambien to be sent to [facility].   Last refill 10/11/18  Zolpidem 10mg , take 1 tab at bedtime as needed, #30 no refills.   Patient LOV- 09/20/18 with Dr. Ander Slade-  Instructions  Chronic insomnia-Ambien continues to help  Continue Ambien Give yourself breaks here and there  You can call us anytime when you are able to get a home sleep study performed  Continue with behavioral modifications Risk of significant sleep apnea will increase if you continue to gain weight  We will see you back in a year            Message routed to [facility], app of the day

## 2018-11-09 NOTE — Telephone Encounter (Signed)
Pt returning call.  [phone]

## 2018-11-09 NOTE — Telephone Encounter (Signed)
Received a RX request from patient's pharmacy.   RX refill is for zolpidem 10mg . Last RX was on 10/11/18 for 30 tablets. Next OV is for next Sept for 41yr f/u.   [facility] [address]   AO, please advise if this refill is ok. I have pended the RX for you. Thanks!

## 2018-11-09 NOTE — Telephone Encounter (Signed)
Received this refill this morning under Brian's name. Created a refill encounter and sent it to AO. Will await his response since he is the only one to refill this.

## 2018-11-09 NOTE — Telephone Encounter (Signed)
Message routed to Dr.Olalere to refill Zolpidem for Patient.

## 2018-11-12 ENCOUNTER — Telehealth: Payer: Self-pay | Admitting: Pulmonary Disease

## 2018-11-12 NOTE — Telephone Encounter (Signed)
Can we heck that this patient refilled his Ambien  I got 2 medication refill request which I believe I have done  It is a controlled substance, I do not want to refill it again if he already received the medication

## 2018-11-12 NOTE — Telephone Encounter (Signed)
This has been taken care of and the pharmacy received the original refill on 11/10/2018.

## 2018-11-12 NOTE — Telephone Encounter (Signed)
Can we check that this patient has a refill for his Ambien  Got to medication refill request with a warning that it may be done already  It is a controlled substance

## 2018-11-20 NOTE — Telephone Encounter (Signed)
Already addressed

## 2018-12-09 ENCOUNTER — Other Ambulatory Visit: Payer: Self-pay

## 2018-12-09 DIAGNOSIS — F5104 Psychophysiologic insomnia: Secondary | ICD-10-CM

## 2018-12-10 ENCOUNTER — Other Ambulatory Visit: Payer: Self-pay | Admitting: Pulmonary Disease

## 2018-12-10 DIAGNOSIS — F5104 Psychophysiologic insomnia: Secondary | ICD-10-CM

## 2018-12-10 NOTE — Telephone Encounter (Signed)
Last fill 11/09/18

## 2019-01-07 ENCOUNTER — Other Ambulatory Visit: Payer: Self-pay | Admitting: Pulmonary Disease

## 2019-01-07 DIAGNOSIS — F5104 Psychophysiologic insomnia: Secondary | ICD-10-CM

## 2019-02-06 ENCOUNTER — Other Ambulatory Visit: Payer: Self-pay | Admitting: Pulmonary Disease

## 2019-02-06 DIAGNOSIS — F5104 Psychophysiologic insomnia: Secondary | ICD-10-CM

## 2019-03-07 ENCOUNTER — Encounter: Payer: Managed Care, Other (non HMO) | Admitting: Internal Medicine

## 2019-03-08 ENCOUNTER — Other Ambulatory Visit: Payer: Self-pay | Admitting: Pulmonary Disease

## 2019-03-08 DIAGNOSIS — F5104 Psychophysiologic insomnia: Secondary | ICD-10-CM

## 2019-03-09 ENCOUNTER — Other Ambulatory Visit: Payer: Self-pay

## 2019-03-09 DIAGNOSIS — F5104 Psychophysiologic insomnia: Secondary | ICD-10-CM

## 2019-03-11 ENCOUNTER — Telehealth: Payer: Self-pay | Admitting: Pulmonary Disease

## 2019-03-11 MED ORDER — ZOLPIDEM TARTRATE 10 MG PO TABS: ORAL_TABLET | ORAL | 0 refills | Status: DC

## 2019-03-11 NOTE — Telephone Encounter (Signed)
Called and spoke with Patient.  Patient  requesting Amb[facility] refill to be [facility] [address]  Last refill 02/07/19 #30, no refills  Message routed to Dr. Wynona Neat

## 2019-03-11 NOTE — Telephone Encounter (Signed)
Dr. Wynona Neat please advise on Ambien refill.  rx refill has been pended in pt's chart. Thanks!

## 2019-03-13 NOTE — Telephone Encounter (Signed)
Patient aware. Nothing further at this time.  

## 2019-03-13 NOTE — Telephone Encounter (Signed)
one was sent on 2/22 /2021

## 2019-03-20 ENCOUNTER — Encounter: Payer: Managed Care, Other (non HMO) | Admitting: Internal Medicine

## 2019-04-05 ENCOUNTER — Telehealth: Payer: Self-pay | Admitting: Pulmonary Disease

## 2019-04-05 NOTE — Telephone Encounter (Signed)
Called and spoke with Patient.  Patient stated he is requesting his Ambien prescription to be sent to [facility].  Patient stated he is not out, but requesting so medication will be ready at pharmacy when due.  LOV 09/20/18  Ambien 62mh, take 1 at bedtime, as needed, #30, no refills   Message routed to Dr. Wynona Neat

## 2019-04-07 ENCOUNTER — Other Ambulatory Visit: Payer: Self-pay | Admitting: Pulmonary Disease

## 2019-04-07 DIAGNOSIS — F5104 Psychophysiologic insomnia: Secondary | ICD-10-CM

## 2019-04-08 NOTE — Telephone Encounter (Signed)
Called and left message on Patient's VM, that prescription has been sent to requested pharmacy.  Nothing further at this time.

## 2019-04-08 NOTE — Telephone Encounter (Signed)
Addressed.

## 2019-04-15 ENCOUNTER — Encounter: Payer: Self-pay | Admitting: Internal Medicine

## 2019-04-15 ENCOUNTER — Other Ambulatory Visit: Payer: Self-pay

## 2019-04-15 ENCOUNTER — Ambulatory Visit (INDEPENDENT_AMBULATORY_CARE_PROVIDER_SITE_OTHER): Payer: Managed Care, Other (non HMO) | Admitting: Internal Medicine

## 2019-04-15 VITALS — BP 122/70 | HR 90 | Temp 98.1°F | Ht 65.0 in | Wt 269.2 lb

## 2019-04-15 DIAGNOSIS — Z0000 Encounter for general adult medical examination without abnormal findings: Secondary | ICD-10-CM | POA: Diagnosis not present

## 2019-04-15 NOTE — Assessment & Plan Note (Signed)

## 2019-04-15 NOTE — Progress Notes (Signed)
Subjective:    Patient ID: Paul Parsons, male    DOB: Dec 28, 1995, 24 y.o.   MRN: [id number]  HPI  Here for wellness and f/u;  Overall doing ok;  Pt denies Chest pain, worsening SOB, DOE, wheezing, orthopnea, PND, worsening LE edema, palpitations, dizziness or syncope.  Pt denies neurological change such as new headache, facial or extremity weakness.  Pt denies polydipsia, polyuria, or low sugar symptoms. Pt states overall good compliance with treatment and medications, good tolerability, and has been trying to follow appropriate diet.  Pt denies worsening depressive symptoms, suicidal ideation or panic. No fever, night sweats, wt loss, loss of appetite, or other constitutional symptoms.  Pt states good ability with ADL's, has low fall risk, home safety reviewed and adequate, no other significant changes in hearing or vision, and only occasionally active with exercise. No new complaints Past Medical History:  Diagnosis Date  . Abdominal pain, recurrent   . Abdominal pain, recurrent   . Asthma   . Chronic constipation    Very large hard stools  . Chronic constipation   . IBS (irritable bowel syndrome)    Past Surgical History:  Procedure Laterality Date  . CHOLECYSTECTOMY    . ERCP  2012  . MOUTH SURGERY      x 2 , wisdom teeth    reports that he has never smoked. He has never used smokeless tobacco. He reports that he does not drink alcohol or use drugs. family history includes Breast cancer in his mother. Allergies  Allergen Reactions  . Tetracyclines & Related   . Zithromax [Azithromycin Dihydrate]    Current Outpatient Medications on File Prior to Visit  Medication Sig Dispense Refill  . budesonide-formoterol (SYMBICORT) 160-4.5 MCG/ACT inhaler Inhale 2 puffs into the lungs as needed.     . Melatonin 10 MG TABS Take 10 mg by mouth daily as needed.     . zolpidem (AMBIEN) 10 MG tablet TAKE 1 TABLET BY MOUTH AT BEDTIME AS NEEDED FOR SLEEP 30 tablet 0  . PRESCRIPTION MEDICATION  Ibgard 1 2x daily     No current facility-administered medications on file prior to visit.   Review of Systems All otherwise neg per pt     Objective:   Physical Exam BP 122/70   Pulse 90   Temp 98.1 F (36.7 C)   Ht 5\' 5"  (1.651 m)   Wt 269 lb 3.2 oz (122.1 kg)   SpO2 98%   BMI 44.80 kg/m  VS noted,  Constitutional: Pt appears in NAD HENT: Head: NCAT.  Right Ear: External ear normal.  Left Ear: External ear normal.  Eyes: . Pupils are equal, round, and reactive to light. Conjunctivae and EOM are normal Nose: without d/c or deformity Neck: Neck supple. Gross normal ROM Cardiovascular: Normal rate and regular rhythm.   Pulmonary/Chest: Effort normal and breath sounds without rales or wheezing.  Abd:  Soft, NT, ND, + BS, no organomegaly Neurological: Pt is alert. At baseline orientation, motor grossly intact Skin: Skin is warm. No rashes, other new lesions, no LE edema Psychiatric: Pt behavior is normal without agitation  All otherwise neg per pt Lab Results  Component Value Date   WBC 10.6 09/22/2017   HGB 15.9 09/22/2017   HCT 47.5 09/22/2017   PLT 348 09/22/2017   GLUCOSE 91 09/22/2017   CHOL 153 09/22/2017   TRIG 115 09/22/2017   HDL 50 09/22/2017   LDLCALC 80 09/22/2017   ALT 32 09/22/2017   AST 21  09/22/2017   NA 139 09/22/2017   K 4.5 09/22/2017   CL 98 09/22/2017   CREATININE 1.11 09/22/2017   BUN 21 (H) 09/22/2017   CO2 24 09/22/2017   TSH 1.710 09/22/2017          Assessment & Plan:

## 2019-04-15 NOTE — Patient Instructions (Signed)
Please continue all other medications as before, and refills have been done if requested.  Please have the pharmacy call with any other refills you may need.  Please continue your efforts at being more active, low cholesterol diet, and weight control.  You are otherwise up to date with prevention measures today.  Please keep your appointments with your specialists as you may have planned  Please have your labs at [facility] as you mentioned  You will be contacted by phone if any changes need to be made immediately.  Otherwise, you will receive a letter about your results with an explanation, but please check with MyChart first.  Please remember to sign up for MyChart if you have not done so, as this will be important to you in the future with finding out test results, communicating by private email, and scheduling acute appointments online when needed.  Please make an Appointment to return for your 1 year visit, or sooner if needed

## 2019-05-07 ENCOUNTER — Telehealth: Payer: Self-pay | Admitting: Pulmonary Disease

## 2019-05-07 NOTE — Telephone Encounter (Signed)
Called and spoke with pt who is requesting a refill of his [place]. Preferred pharmacy is [facility].  Dr. Wynona Neat, please advise.

## 2019-05-08 ENCOUNTER — Other Ambulatory Visit: Payer: Self-pay | Admitting: Pulmonary Disease

## 2019-05-08 DIAGNOSIS — F5104 Psychophysiologic insomnia: Secondary | ICD-10-CM

## 2019-05-16 NOTE — Telephone Encounter (Signed)
Refill was sent in on 05/08/19 by Dr. Wynona Neat.

## 2019-06-06 ENCOUNTER — Other Ambulatory Visit: Payer: Self-pay | Admitting: Pulmonary Disease

## 2019-06-06 ENCOUNTER — Telehealth: Payer: Self-pay | Admitting: Pulmonary Disease

## 2019-06-06 DIAGNOSIS — F5104 Psychophysiologic insomnia: Secondary | ICD-10-CM

## 2019-06-06 NOTE — Telephone Encounter (Signed)
Dr Val Eagle, pt is requesting ambien 10 mg and this was last filled on 05/08/19  He uses [facility] [address]

## 2019-06-07 NOTE — Telephone Encounter (Signed)
Spoke with the pt and advised we are waiting on response from Dr Wynona Neat regarding refill  Pt verbalized understanding  Please advise on refill thanks

## 2019-06-07 NOTE — Telephone Encounter (Signed)
Pt calling back about having Ambien refilled.  Please advise.

## 2019-06-10 NOTE — Telephone Encounter (Signed)
Dr. Wynona Neat please advise have you sent in this refill for patient?

## 2019-06-10 NOTE — Telephone Encounter (Signed)
Called and spoke with pt to see if he was able to pick up the ambien Rx Friday 5/21 and pt stated he was able to. Nothing further needed.

## 2019-06-10 NOTE — Telephone Encounter (Signed)
Ambien was sent in 06/07/2019

## 2019-06-24 ENCOUNTER — Encounter: Payer: Self-pay | Admitting: Internal Medicine

## 2019-06-27 ENCOUNTER — Ambulatory Visit: Payer: Managed Care, Other (non HMO) | Admitting: Internal Medicine

## 2019-06-27 ENCOUNTER — Other Ambulatory Visit: Payer: Self-pay

## 2019-06-27 ENCOUNTER — Encounter: Payer: Self-pay | Admitting: Internal Medicine

## 2019-06-27 VITALS — BP 142/82 | HR 85 | Temp 97.9°F | Ht 65.0 in | Wt 271.0 lb

## 2019-06-27 DIAGNOSIS — J309 Allergic rhinitis, unspecified: Secondary | ICD-10-CM

## 2019-06-27 DIAGNOSIS — G4700 Insomnia, unspecified: Secondary | ICD-10-CM

## 2019-06-27 DIAGNOSIS — F419 Anxiety disorder, unspecified: Secondary | ICD-10-CM | POA: Diagnosis not present

## 2019-06-27 MED ORDER — CLONAZEPAM 1 MG PO TABS: ORAL_TABLET | ORAL | 2 refills | Status: DC

## 2019-06-27 NOTE — Patient Instructions (Addendum)
Please take all new medication as prescribed - the clonazepam as needed  Please consider the Counseling that you have available  Please continue all other medications as before, and refills have been done if requested.  Please have the pharmacy call with any other refills you may need.  Please continue your efforts at being more active, low cholesterol diet, and weight control.  Please keep your appointments with your specialists as you may have planned

## 2019-06-27 NOTE — Progress Notes (Signed)
Subjective:    Patient ID: Paul Parsons, male    DOB: Jan 06, 1996, 24 y.o.   MRN: [id number]  HPI  Here to f/u; overall doing ok,  Pt denies chest pain, increasing sob or doe, wheezing, orthopnea, PND, increased LE swelling, palpitations, dizziness or syncope.  Pt denies new neurological symptoms such as new headache, or facial or extremity weakness or numbness.  Pt denies polydipsia, polyuria, Denies worsening depressive symptoms, suicidal ideation, or panic; has ongoing anxiety, increased recently overall with multiple social stressors, also with marked worsening difficulty with getting to sleep most nights for 3 mo..  Does have several wks ongoing nasal allergy symptoms with clearish congestion, itch and sneezing, without fever, pain, ST, cough, swelling or wheezing. Past Medical History:  Diagnosis Date  . Abdominal pain, recurrent   . Abdominal pain, recurrent   . Asthma   . Chronic constipation    Very large hard stools  . Chronic constipation   . IBS (irritable bowel syndrome)    Past Surgical History:  Procedure Laterality Date  . CHOLECYSTECTOMY    . ERCP  2012  . MOUTH SURGERY      x 2 , wisdom teeth    reports that he has never smoked. He has never used smokeless tobacco. He reports that he does not drink alcohol and does not use drugs. family history includes Breast cancer in his mother. Allergies  Allergen Reactions  . Tetracyclines & Related   . Zithromax [Azithromycin Dihydrate]    Current Outpatient Medications on File Prior to Visit  Medication Sig Dispense Refill  . budesonide-formoterol (SYMBICORT) 160-4.5 MCG/ACT inhaler Inhale 2 puffs into the lungs as needed.     . Melatonin 10 MG TABS Take 10 mg by mouth daily as needed.     Marland Kitchen PRESCRIPTION MEDICATION Ibgard 1 2x daily    . zolpidem (AMBIEN) 10 MG tablet TAKE 1 TABLET BY MOUTH AT BEDTIME AS NEEDED FOR SLEEP 30 tablet 0   No current facility-administered medications on file prior to visit.   Review of  Systems All otherwise neg per pt     Objective:   Physical Exam BP (!) 142/82 (BP Location: Left Arm, Patient Position: Sitting, Cuff Size: Large)   Pulse 85   Temp 97.9 F (36.6 C) (Oral)   Ht 5\' 5"  (1.651 m)   Wt 271 lb (122.9 kg)   SpO2 97%   BMI 45.10 kg/m  VS noted,  Constitutional: Pt appears in NAD HENT: Head: NCAT.  Right Ear: External ear normal.  Left Ear: External ear normal.  Eyes: . Pupils are equal, round, and reactive to light. Conjunctivae and EOM are normal Nose: without d/c or deformity Neck: Neck supple. Gross normal ROM Cardiovascular: Normal rate and regular rhythm.   Pulmonary/Chest: Effort normal and breath sounds without rales or wheezing.  Abd:  Soft, NT, ND, + BS, no organomegaly Neurological: Pt is alert. At baseline orientation, motor grossly intact Skin: Skin is warm. No rashes, other new lesions, no LE edema Psychiatric: Pt behavior is normal without agitation  All otherwise neg per pt Lab Results  Component Value Date   WBC 10.6 09/22/2017   HGB 15.9 09/22/2017   HCT 47.5 09/22/2017   PLT 348 09/22/2017   GLUCOSE 91 09/22/2017   CHOL 153 09/22/2017   TRIG 115 09/22/2017   HDL 50 09/22/2017   LDLCALC 80 09/22/2017   ALT 32 09/22/2017   AST 21 09/22/2017   NA 139 09/22/2017   K 4.5  09/22/2017   CL 98 09/22/2017   CREATININE 1.11 09/22/2017   BUN 21 (H) 09/22/2017   CO2 24 09/22/2017   TSH 1.710 09/22/2017       Assessment & Plan:

## 2019-06-30 ENCOUNTER — Encounter: Payer: Self-pay | Admitting: Internal Medicine

## 2019-06-30 DIAGNOSIS — G4700 Insomnia, unspecified: Secondary | ICD-10-CM | POA: Insufficient documentation

## 2019-06-30 NOTE — Assessment & Plan Note (Signed)
Also for add allegra otc prn

## 2019-06-30 NOTE — Assessment & Plan Note (Signed)
Mild to mod, for ambien qhs prn,  to f/u any worsening symptoms or concerns  

## 2019-06-30 NOTE — Assessment & Plan Note (Addendum)
Mild to mod, for klonopin prn,,  to f/u any worsening symptoms or concerns  I spent 31 minutes in preparing to see the patient by review of recent labs, imaging and procedures, obtaining and reviewing separately obtained history, communicating with the patient and family or caregiver, ordering medications, tests or procedures, and documenting clinical information in the EHR including the differential Dx, treatment, and any further evaluation and other management of anxiety, insomnia, allergies

## 2019-07-05 ENCOUNTER — Telehealth: Payer: Self-pay | Admitting: Pulmonary Disease

## 2019-07-05 NOTE — Telephone Encounter (Signed)
Dr. Val Eagle, please see message from pt as he is requesting to have his Ambien refilled and sent to [facility].

## 2019-07-08 ENCOUNTER — Other Ambulatory Visit: Payer: Self-pay | Admitting: Pulmonary Disease

## 2019-07-08 DIAGNOSIS — F5104 Psychophysiologic insomnia: Secondary | ICD-10-CM

## 2019-07-08 NOTE — Telephone Encounter (Signed)
Ambien order signed and sent to pharmacy

## 2019-07-08 NOTE — Telephone Encounter (Signed)
Called and spoke with pt letting him know that med has been sent to pharmacy by AO. Pt verbalized understanding.  Nothing further needed.

## 2019-07-08 NOTE — Telephone Encounter (Signed)
Pt calling back to have Ambien refilled.  Pt is out completely.  Please advise.  [facility]  [phone]

## 2019-07-08 NOTE — Telephone Encounter (Signed)
Called and spoke with pt. Pt is still waiting on refill of his ambien. He stated that he is now completely out of the med.  Dr. Val Eagle, please advise if you are okay refilling med for pt.

## 2019-08-06 ENCOUNTER — Other Ambulatory Visit: Payer: Self-pay | Admitting: Pulmonary Disease

## 2019-08-06 DIAGNOSIS — F5104 Psychophysiologic insomnia: Secondary | ICD-10-CM

## 2019-08-06 NOTE — Telephone Encounter (Signed)
Pt called about this. Would like refill .

## 2019-08-06 NOTE — Telephone Encounter (Signed)
Dr. O, please advise if you are okay refilling med. 

## 2019-08-07 ENCOUNTER — Other Ambulatory Visit: Payer: Self-pay

## 2019-08-07 DIAGNOSIS — F5104 Psychophysiologic insomnia: Secondary | ICD-10-CM

## 2019-08-07 MED ORDER — ZOLPIDEM TARTRATE 10 MG PO TABS: ORAL_TABLET | ORAL | 0 refills | Status: DC

## 2019-08-07 NOTE — Telephone Encounter (Signed)
Dr. Val Eagle please advise on refill request.  Thanks!

## 2019-09-06 ENCOUNTER — Other Ambulatory Visit: Payer: Self-pay | Admitting: Pulmonary Disease

## 2019-09-06 ENCOUNTER — Other Ambulatory Visit: Payer: Self-pay

## 2019-09-06 DIAGNOSIS — F5104 Psychophysiologic insomnia: Secondary | ICD-10-CM

## 2019-09-06 NOTE — Telephone Encounter (Signed)
Pt calling to get e [facility] [address] Pt can be reached at [phone]

## 2019-09-06 NOTE — Telephone Encounter (Signed)
Dr. O please advise on refill request.  Thanks!  

## 2019-09-07 MED ORDER — ZOLPIDEM TARTRATE 10 MG PO TABS: ORAL_TABLET | ORAL | 0 refills | Status: DC

## 2019-09-11 NOTE — Telephone Encounter (Signed)
Order for Ambien was refilled 09/07/2019

## 2019-09-11 NOTE — Telephone Encounter (Signed)
Requesting refill for Ambien.  Please advise.  Thank you.

## 2019-09-24 ENCOUNTER — Other Ambulatory Visit: Payer: Self-pay | Admitting: Internal Medicine

## 2019-09-24 NOTE — Telephone Encounter (Signed)
Done erx 

## 2019-10-07 ENCOUNTER — Other Ambulatory Visit: Payer: Self-pay | Admitting: Pulmonary Disease

## 2019-10-07 ENCOUNTER — Other Ambulatory Visit: Payer: Self-pay

## 2019-10-07 DIAGNOSIS — F5104 Psychophysiologic insomnia: Secondary | ICD-10-CM

## 2019-10-08 NOTE — Telephone Encounter (Signed)
Dr. O, please advise if you are okay refilling med. 

## 2019-11-06 ENCOUNTER — Other Ambulatory Visit: Payer: Self-pay

## 2019-11-06 DIAGNOSIS — F5104 Psychophysiologic insomnia: Secondary | ICD-10-CM

## 2019-11-06 MED ORDER — ZOLPIDEM TARTRATE 10 MG PO TABS: ORAL_TABLET | ORAL | 0 refills | Status: DC

## 2019-11-29 ENCOUNTER — Ambulatory Visit: Payer: Managed Care, Other (non HMO) | Admitting: Family

## 2019-11-29 ENCOUNTER — Other Ambulatory Visit: Payer: Self-pay

## 2019-11-29 ENCOUNTER — Encounter: Payer: Self-pay | Admitting: Family

## 2019-11-29 VITALS — BP 128/72 | HR 84 | Temp 98.0°F | Ht 65.0 in | Wt 276.0 lb

## 2019-11-29 DIAGNOSIS — R21 Rash and other nonspecific skin eruption: Secondary | ICD-10-CM | POA: Diagnosis not present

## 2019-11-29 DIAGNOSIS — L509 Urticaria, unspecified: Secondary | ICD-10-CM

## 2019-11-29 MED ORDER — PREDNISONE 20 MG PO TABS: 40.0000 mg | ORAL_TABLET | Freq: Every day | ORAL | 0 refills | Status: DC

## 2019-11-29 MED ORDER — HYDROXYZINE HCL 10 MG PO TABS: 10.0000 mg | ORAL_TABLET | Freq: Three times a day (TID) | ORAL | 0 refills | Status: DC | PRN

## 2019-11-29 MED ORDER — METHYLPREDNISOLONE ACETATE 40 MG/ML IJ SUSP
40.0000 mg | Freq: Once | INTRAMUSCULAR | Status: AC
  Administered 2019-11-29: 40 mg via INTRAMUSCULAR

## 2019-11-29 NOTE — Patient Instructions (Signed)
Please take your Pepcid twice a day in addition to the Atarax for the itching/ hives; Start your oral prednisone tomorrow;

## 2019-11-29 NOTE — Progress Notes (Signed)
Paul Parsons is a 24 y.o. male with the following history as recorded in EpicCare:  Patient Active Problem List   Diagnosis Date Noted  . Insomnia 06/30/2019  . Hypersomnolence 12/18/2017  . Anxiety 08/29/2016  . Obesity 08/26/2016  . Allergic rhinitis 08/26/2016  . Hematochezia 12/29/2015  . Preventative health care 06/24/2015  . Primary insomnia 03/07/2014  . Abdominal pain     Current Outpatient Medications  Medication Sig Dispense Refill  . budesonide-formoterol (SYMBICORT) 160-4.5 MCG/ACT inhaler Inhale 2 puffs into the lungs as needed.     . clonazePAM (KLONOPIN) 1 MG tablet TAKE 1/2 - 1 TABLET BY MOUTH ONCE DAILY AS NEEDED 30 tablet 2  . Melatonin 10 MG TABS Take 10 mg by mouth daily as needed.     Marland Kitchen PRESCRIPTION MEDICATION Ibgard 1 2x daily    . zolpidem (AMBIEN) 10 MG tablet TAKE 1 TABLET BY MOUTH AT BEDTIME AS NEEDED FOR SLEEP 30 tablet 0  . hydrOXYzine (ATARAX/VISTARIL) 10 MG tablet Take 1 tablet (10 mg total) by mouth 3 (three) times daily as needed for itching. 30 tablet 0  . predniSONE (DELTASONE) 20 MG tablet Take 2 tablets (40 mg total) by mouth daily with breakfast. 10 tablet 0   No current facility-administered medications for this visit.    Allergies: Tetracyclines & related and Zithromax [azithromycin dihydrate]  Past Medical History:  Diagnosis Date  . Abdominal pain, recurrent   . Abdominal pain, recurrent   . Asthma   . Chronic constipation    Very large hard stools  . Chronic constipation   . IBS (irritable bowel syndrome)     Past Surgical History:  Procedure Laterality Date  . CHOLECYSTECTOMY    . ERCP  2012  . MOUTH SURGERY      x 2 , wisdom teeth    Family History  Problem Relation Age of Onset  . Breast cancer Mother        BREAST  . Alcohol abuse Neg Hx   . Diabetes Neg Hx   . Drug abuse Neg Hx   . Early death Neg Hx   . Heart disease Neg Hx   . Hyperlipidemia Neg Hx   . Hypertension Neg Hx   . Kidney disease Neg Hx   . Stroke  Neg Hx   . Colon cancer Neg Hx   . Stomach cancer Neg Hx     Social History   Tobacco Use  . Smoking status: Never Smoker  . Smokeless tobacco: Never Used  Substance Use Topics  . Alcohol use: No    Alcohol/week: 3.0 standard drinks    Types: 3 Cans of beer per week    Subjective:  Rash on neck/ extremities x 3-4 days; using OTC Benadryl with some benefit as well as topical hydrocortisone cream; did use a new shampoo prior to onset of symptoms; no shortness of breath or difficulty breathing;   Objective:  Vitals:   11/29/19 1046  BP: 128/72  Pulse: 84  Temp: 98 F (36.7 C)  TempSrc: Oral  SpO2: 98%  Weight: 276 lb (125.2 kg)  Height: 5\' 5"  (1.651 m)    General: Well developed, well nourished, in no acute distress  Skin : Warm and dry. Macular erythematous changes noted on neck and in bilateral antecubetal space Head: Normocephalic and atraumatic  Eyes: Sclera and conjunctiva clear; pupils round and reactive to light; extraocular movements intact  Ears: External normal; canals clear; tympanic membranes normal  Oropharynx: Pink, supple. No suspicious lesions  Neck: Supple without thyromegaly, adenopathy  Lungs: Respirations unlabored;  Neurologic: Alert and oriented; speech intact; face symmetrical; moves all extremities well; CNII-XII intact without focal deficit   Assessment:  1. Rash   2. Urticaria     Plan:  Depo-Medrol IM 40 mg given in office; start oral prednisone tomorrow x 5 days; use Atarax and Pepcid for antihistamine benefit; avoid that particular shampoo in the future; follow-up worse, no better.  This visit occurred during the SARS-CoV-2 public health emergency.  Safety protocols were in place, including screening questions prior to the visit, additional usage of staff PPE, and extensive cleaning of exam room while observing appropriate contact time as indicated for disinfecting solutions.     No follow-ups on file.  No orders of the defined types were  placed in this encounter.   Requested Prescriptions   Signed Prescriptions Disp Refills  . hydrOXYzine (ATARAX/VISTARIL) 10 MG tablet 30 tablet 0    Sig: Take 1 tablet (10 mg total) by mouth 3 (three) times daily as needed for itching.  . predniSONE (DELTASONE) 20 MG tablet 10 tablet 0    Sig: Take 2 tablets (40 mg total) by mouth daily with breakfast.

## 2019-12-05 ENCOUNTER — Other Ambulatory Visit: Payer: Self-pay

## 2019-12-05 DIAGNOSIS — F5104 Psychophysiologic insomnia: Secondary | ICD-10-CM

## 2019-12-06 ENCOUNTER — Telehealth: Payer: Self-pay | Admitting: Pulmonary Disease

## 2019-12-06 ENCOUNTER — Other Ambulatory Visit: Payer: Self-pay | Admitting: Pulmonary Disease

## 2019-12-06 ENCOUNTER — Other Ambulatory Visit: Payer: Self-pay

## 2019-12-06 DIAGNOSIS — F5104 Psychophysiologic insomnia: Secondary | ICD-10-CM

## 2019-12-06 MED ORDER — ZOLPIDEM TARTRATE 10 MG PO TABS: ORAL_TABLET | ORAL | 2 refills | Status: DC

## 2019-12-06 NOTE — Telephone Encounter (Signed)
Rx for Ambien has been sent to pharmacy for pt by AO. Attempted to call pt to let him know this had been done but unable to reach. Left pt a detailed message letting him know that this had been done. Nothing further needed.

## 2019-12-06 NOTE — Telephone Encounter (Signed)
Dr. O, please advise if you are okay refilling med. 

## 2019-12-21 ENCOUNTER — Other Ambulatory Visit: Payer: Self-pay | Admitting: Internal Medicine

## 2019-12-21 ENCOUNTER — Encounter: Payer: Self-pay | Admitting: Internal Medicine

## 2020-03-05 ENCOUNTER — Encounter: Payer: Self-pay | Admitting: Adult Health

## 2020-03-05 ENCOUNTER — Ambulatory Visit (INDEPENDENT_AMBULATORY_CARE_PROVIDER_SITE_OTHER): Payer: Managed Care, Other (non HMO) | Admitting: Adult Health

## 2020-03-05 ENCOUNTER — Other Ambulatory Visit: Payer: Self-pay

## 2020-03-05 DIAGNOSIS — G4710 Hypersomnia, unspecified: Secondary | ICD-10-CM | POA: Diagnosis not present

## 2020-03-05 DIAGNOSIS — F5104 Psychophysiologic insomnia: Secondary | ICD-10-CM | POA: Diagnosis not present

## 2020-03-05 DIAGNOSIS — F5101 Primary insomnia: Secondary | ICD-10-CM | POA: Diagnosis not present

## 2020-03-05 MED ORDER — ZOLPIDEM TARTRATE 10 MG PO TABS: ORAL_TABLET | ORAL | 0 refills | Status: DC

## 2020-03-05 NOTE — Assessment & Plan Note (Signed)
Chronic insomnia.  Long discussion with patient regarding healthy sleep regimen.  Have suggested counseling for insomnia as he also has associated anxiety.  Have discussed limited use of sedating medications and sleep aids. Patient also has risk factors for sleep apnea including BMI at 47.  He does have daytime sleepiness and fatigue if he does not take Ambien.  He is at risk for sleep apnea.  Patient is in agreement to proceed with a home sleep study.  Plan  Patient Instructions  Home sleep study  Healthy sleep regimen  Ambien At bedtime  As needed  Insomnia .  Follow up with Dr Wynona Neat in 6 months and As needed

## 2020-03-05 NOTE — Patient Instructions (Addendum)
Home sleep study  Healthy sleep regimen  Ambien At bedtime  As needed  Insomnia .  Follow up with Dr Wynona Neat in 6 months and As needed

## 2020-03-05 NOTE — Progress Notes (Signed)
$'@Patient'u$  ID: Paul Parsons, male    DOB: [birth year], 25 y.o.   MRN: [id number]  Chief Complaint  Patient presents with  . Follow-up    Referring provider: Biagio Borg, MD  HPI: 25 year old male followed for chronic insomnia  TEST/EVENTS :   03/05/2020 follow-up insomnia Patient presents for a follow-up visit.  Last seen September 2020.  Patient has history of chronic insomnia.  States his he has had trouble sleeping since his early teenage years.  Has tried multiple sleep aids including over-the-counter, melatonin, Ambien, Lunesta.  Patient says Ambien works the best for him.  He is able to take Ambien and melatonin and have very good sleep.  He feels good after getting up in the morning.  He does not have daytime sleepiness.  However if he does not take Ambien he has trouble falling asleep has restless sleep and gets up multiple times throughout the night.  And gets minimal sleep.  He also has daytime sleepiness and daytime fatigue if he does not take the Ambien.  On average with Ambien he is able to sleep 8 hours.  Patient does have some intermittent anxiety.  He does use Klonopin but uses this very rarely and only during the daytime.  Patient is aware of the sedating effects of both medications Patient says he has graduated from [employer] in [place].  From psychology.  He is currently getting ready go to grad school for mental health counseling.  Patient does not use alcohol.  Denies any illicit drug use.  Patient denies any known snoring.  He does have a family history of sleep apnea.  Patient's weight continues to climb.  He is up 70 pounds over the last 4 years. Typically goes to bed about 1:59 AM.  Gets up at 9 AM.  Says he has a bad habit of going to bed super light.  Has trouble going to sleep so he has just always going to sleep later.  He typically drinks about 1 cup of caffeine daily.  No soda or tea.  No stimulants. Patient education on insomnia.  Patient  education on chronic use of sleep aids and potential dangers..      Allergies  Allergen Reactions  . Tetracyclines & Related   . Zithromax [Azithromycin Dihydrate]     Immunization History  Administered Date(s) Administered  . DTaP 09/03/1996, 11/08/1996, 01/07/1997, 12/29/1997, 07/10/2001  . HPV Quadrivalent 11/19/2013, 12/20/2013  . Hepatitis A 09/30/2005, 09/14/2006  . Hepatitis B 1996/08/19, 08/09/1996, 04/09/1997  . HiB (PRP-OMP) 09/03/1996, 11/08/1996, 01/07/1997, 10/07/1997  . IPV 09/03/1996, 11/08/1996, 12/29/1997, 07/10/2001  . Influenza,inj,Quad PF,6+ Mos 10/29/2017, 10/18/2019  . Influenza-Unspecified 10/23/2003, 10/12/2004, 11/09/2005, 11/11/2006, 10/21/2008, 09/07/2009, 10/03/2012, 11/17/2013  . MMR 10/07/1997, 07/10/2001  . Meningococcal Conjugate 09/30/2005, 10/03/2012  . Moderna SARS-COV2 Booster Vaccination 01/01/2020  . Moderna Sars-Covid-2 Vaccination 03/15/2019, 03/19/2019, 04/15/2019, 04/16/2019  . Pneumococcal-Unspecified 04/16/1998, 07/03/1998  . Td 07/25/2007  . Tdap 07/25/2007  . Varicella 07/15/1997, 09/14/2006    Past Medical History:  Diagnosis Date  . Abdominal pain, recurrent   . Abdominal pain, recurrent   . Asthma   . Chronic constipation    Very large hard stools  . Chronic constipation   . IBS (irritable bowel syndrome)     Tobacco History: Social History   Tobacco Use  Smoking Status Never Smoker  Smokeless Tobacco Never Used   Counseling given: Not Answered   Outpatient Medications Prior to Visit  Medication Sig Dispense Refill  . clonazePAM (KLONOPIN)  1 MG tablet TAKE 1/2 - 1 TABLET BY MOUTH ONCE DAILY AS NEEDED 30 tablet 5  . Melatonin 10 MG TABS Take 10 mg by mouth daily as needed.     Marland Kitchen PRESCRIPTION MEDICATION Ibgard 1 2x daily    . zolpidem (AMBIEN) 10 MG tablet TAKE 1 TABLET BY MOUTH AT BEDTIME AS NEEDED FOR SLEEP 30 tablet 2  . budesonide-formoterol (SYMBICORT) 160-4.5 MCG/ACT inhaler Inhale 2 puffs into the lungs  as needed.  (Patient not taking: Reported on 03/05/2020)    . hydrOXYzine (ATARAX/VISTARIL) 10 MG tablet Take 1 tablet (10 mg total) by mouth 3 (three) times daily as needed for itching. (Patient not taking: Reported on 03/05/2020) 30 tablet 0  . predniSONE (DELTASONE) 20 MG tablet Take 2 tablets (40 mg total) by mouth daily with breakfast. (Patient not taking: Reported on 03/05/2020) 10 tablet 0   No facility-administered medications prior to visit.     Review of Systems:   Constitutional:   No  weight loss, night sweats,  Fevers, chills, fatigue, or  lassitude.  HEENT:   No headaches,  Difficulty swallowing,  Tooth/dental problems, or  Sore throat,                No sneezing, itching, ear ache, nasal congestion, post nasal drip,   CV:  No chest pain,  Orthopnea, PND, swelling in lower extremities, anasarca, dizziness, palpitations, syncope.   GI  No heartburn, indigestion, abdominal pain, nausea, vomiting, diarrhea, change in bowel habits, loss of appetite, bloody stools.   Resp: No shortness of breath with exertion or at rest.  No excess mucus, no productive cough,  No non-productive cough,  No coughing up of blood.  No change in color of mucus.  No wheezing.  No chest wall deformity  Skin: no rash or lesions.  GU: no dysuria, change in color of urine, no urgency or frequency.  No flank pain, no hematuria   MS:  No joint pain or swelling.  No decreased range of motion.  No back pain.    Physical Exam  BP 110/80 (BP Location: Left Arm, Patient Position: Sitting, Cuff Size: Large)   Pulse 72   Temp 97.7 F (36.5 C) (Temporal)   Ht $R'5\' 5"'SS$  (1.651 m)   Wt 287 lb 3.2 oz (130.3 kg)   SpO2 95%   BMI 47.79 kg/m   GEN: A/Ox3; pleasant , NAD, BMI 47   HEENT:  [place]/AT,   NOSE-clear, THROAT-clear, no lesions, no postnasal drip or exudate noted.  Class II-III NP airway, elongated uvula.  NECK:  Supple w/ fair ROM; no JVD; normal carotid impulses w/o bruits; no thyromegaly or nodules  palpated; no lymphadenopathy.    RESP  Clear  P & A; w/o, wheezes/ rales/ or rhonchi. no accessory muscle use, no dullness to percussion  CARD:  RRR, no m/r/g, no peripheral edema, pulses intact, no cyanosis or clubbing.  GI:   Soft & nt; nml bowel sounds; no organomegaly or masses detected.   Musco: Warm bil, no deformities or joint swelling noted.   Neuro: alert, no focal deficits noted.    Skin: Warm, no lesions or rashes    Lab Results:  BMET  No results found for: BNP  ProBNP No results found for: PROBNP  Imaging: No results found.    No flowsheet data found.  No results found for: NITRICOXIDE      Assessment & Plan:   Primary insomnia Chronic insomnia.  Long discussion with patient regarding healthy  sleep regimen.  Have suggested counseling for insomnia as he also has associated anxiety.  Have discussed limited use of sedating medications and sleep aids. Patient also has risk factors for sleep apnea including BMI at 47.  He does have daytime sleepiness and fatigue if he does not take Ambien.  He is at risk for sleep apnea.  Patient is in agreement to proceed with a home sleep study.  Plan  Patient Instructions  Home sleep study  Healthy sleep regimen  Ambien At bedtime  As needed  Insomnia .  Follow up with Dr Ander Slade in 6 months and As needed       Hypersomnolence Obesity with BMI of 47, daytime sleepiness, restless sleep.  And chronic insomnia. All risk factors for possible underlying sleep apnea.  Set up for home sleep study.  Patient education given     Rexene Edison, NP 03/05/2020

## 2020-03-05 NOTE — Assessment & Plan Note (Signed)
Obesity with BMI of 47, daytime sleepiness, restless sleep.  And chronic insomnia. All risk factors for possible underlying sleep apnea.  Set up for home sleep study.  Patient education given

## 2020-03-24 ENCOUNTER — Ambulatory Visit: Payer: Managed Care, Other (non HMO)

## 2020-03-24 ENCOUNTER — Other Ambulatory Visit: Payer: Self-pay

## 2020-03-24 DIAGNOSIS — F5104 Psychophysiologic insomnia: Secondary | ICD-10-CM

## 2020-03-24 DIAGNOSIS — G4700 Insomnia, unspecified: Secondary | ICD-10-CM | POA: Diagnosis not present

## 2020-03-30 ENCOUNTER — Telehealth: Payer: Self-pay | Admitting: Pulmonary Disease

## 2020-03-30 DIAGNOSIS — G4710 Hypersomnia, unspecified: Secondary | ICD-10-CM | POA: Diagnosis not present

## 2020-03-30 NOTE — Telephone Encounter (Signed)
Call patient  Sleep study result  Date of study: 03/25/2020  Impression: Negative study for significant sleep disordered breathing No significant oxygen desaturations  Recommendation: Pretest probability appears low, patient's history of chronic insomnia  If there is significant concern for sleep disordered breathing, will suggest an in lab polysomnogram, however, a negative study will suggest absence of moderate to severe disease  Sleep position optimization by encourage sleep in the lateral position, elevating head of the bed may help  Encourage aggressive weight loss measures  Follow-up as previously scheduled   FYI: Tammy Parrett

## 2020-03-30 NOTE — Telephone Encounter (Signed)
Please set up telemedicine or in person visit to discuss sleep study results

## 2020-03-31 NOTE — Telephone Encounter (Signed)
Called and spoke with patient to get him scheduled for either Televisit or in person OV to go over sleep study results. Patient said he could do in person and is now scheduled for Thursday with Tammy Parrett. Will route to her only as FYI. Nothing further needed at this time.   Next Appt With Pulmonology Rubye Oaks, NP) 04/02/2020 at 2:30 PM

## 2020-04-02 ENCOUNTER — Ambulatory Visit: Payer: Managed Care, Other (non HMO) | Admitting: Adult Health

## 2020-04-02 ENCOUNTER — Encounter: Payer: Self-pay | Admitting: Adult Health

## 2020-04-02 ENCOUNTER — Other Ambulatory Visit: Payer: Self-pay

## 2020-04-02 DIAGNOSIS — F5101 Primary insomnia: Secondary | ICD-10-CM

## 2020-04-02 DIAGNOSIS — F5104 Psychophysiologic insomnia: Secondary | ICD-10-CM

## 2020-04-02 MED ORDER — ZOLPIDEM TARTRATE 10 MG PO TABS
ORAL_TABLET | ORAL | 0 refills | Status: DC
Start: 2020-04-02 — End: 2020-05-05

## 2020-04-02 NOTE — Progress Notes (Signed)
_0  ID: Paul Parsons, male    DOB: 1995/03/20, 25 y.o.   MRN: [id number]  Chief Complaint  Patient presents with  . 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  Currently going into grad school for mental health counseling   TEST/EVENTS :  Home sleep study March 25, 2020 - for sleep apnea  04/02/2020 Follow up : Insomnia Patient presents for a follow-up visit.  Patient was seen last month for chronic insomnia.  Has tried multiple medications over the last years for insomnia.  Has trouble going to bed and fall asleep.  Most effective regimen has been Ambien.  Last visit had some risk factors for sleep apnea and set up for home sleep study.  This was negative for sleep apnea.  We discussed today with patient education on healthy sleep regimen. Trying to use alternatives such as stretching, yoga, melatonin and stress reducers to help with insomnia.  And to use Ambien only as needed.  Allergies  Allergen Reactions  . Tetracyclines & Related   . Zithromax [Azithromycin Dihydrate]     Immunization History  Administered Date(s) Administered  . DTaP 09/03/1996, 11/08/1996, 01/07/1997, 12/29/1997, 07/10/2001  . HPV Quadrivalent 11/19/2013, 12/20/2013  . Hepatitis A 09/30/2005, 09/14/2006  . Hepatitis B 12-22-1996, 08/09/1996, 04/09/1997  . HiB (PRP-OMP) 09/03/1996, 11/08/1996, 01/07/1997, 10/07/1997  . IPV 09/03/1996, 11/08/1996, 12/29/1997, 07/10/2001  . Influenza,inj,Quad PF,6+ Mos 10/29/2017, 10/18/2019  . Influenza-Unspecified 10/23/2003, 10/12/2004, 11/09/2005, 11/11/2006, 10/21/2008, 09/07/2009, 10/03/2012, 11/17/2013  . MMR 10/07/1997, 07/10/2001  . Meningococcal Conjugate 09/30/2005, 10/03/2012  . Moderna SARS-COV2 Booster Vaccination 01/01/2020  . Moderna Sars-Covid-2 Vaccination 03/15/2019,  03/19/2019, 04/15/2019, 04/16/2019  . Pneumococcal-Unspecified 04/16/1998, 07/03/1998  . Td 07/25/2007  . Tdap 07/25/2007  . Varicella 07/15/1997, 09/14/2006    Past Medical History:  Diagnosis Date  . Abdominal pain, recurrent   . Abdominal pain, recurrent   . Asthma   . Chronic constipation    Very large hard stools  . Chronic constipation   . IBS (irritable bowel syndrome)     Tobacco History: Social History   Tobacco Use  Smoking Status Never Smoker  Smokeless Tobacco Never Used   Counseling given: Not Answered   Outpatient Medications Prior to Visit  Medication Sig Dispense Refill  . clonazePAM (KLONOPIN) 1 MG tablet TAKE 1/2 - 1 TABLET BY MOUTH ONCE DAILY AS NEEDED 30 tablet 5  . Melatonin 10 MG TABS Take 10 mg by mouth daily as needed.     Marland Kitchen PRESCRIPTION MEDICATION Ibgard 1 2x daily    . zolpidem (AMBIEN) 10 MG tablet TAKE 1 TABLET BY MOUTH AT BEDTIME AS NEEDED FOR SLEEP 30 tablet 0   No facility-administered medications prior to visit.     Review of Systems:   Constitutional:   No  weight loss, night sweats,  Fevers, chills, fatigue, or  lassitude.  HEENT:   No headaches,  Difficulty swallowing,  Tooth/dental problems, or  Sore throat,                No sneezing, itching, ear ache, nasal congestion, post nasal drip,   CV:  No chest pain,  Orthopnea, PND, swelling in lower extremities, anasarca, dizziness, palpitations, syncope.   GI  No heartburn, indigestion, abdominal pain, nausea, vomiting, diarrhea, change in bowel habits, loss of appetite, bloody stools.   Resp: No shortness of  breath with exertion or at rest.  No excess mucus, no productive cough,  No non-productive cough,  No coughing up of blood.  No change in color of mucus.  No wheezing.  No chest wall deformity  Skin: no rash or lesions.  GU: no dysuria, change in color of urine, no urgency or frequency.  No flank pain, no hematuria   MS:  No joint pain or swelling.  No decreased range of  motion.  No back pain.    Physical Exam  BP 134/84 (BP Location: Left Arm, Cuff Size: Normal)   Pulse 89   Temp 98.1 F (36.7 C)   Ht _0  (1.676 m)   Wt 285 lb (129.3 kg)   SpO2 97%   BMI 46.00 kg/m   GEN: A/Ox3; pleasant , NAD, BMI 46    HEENT:  [place]/AT,   , NOSE-clear, THROAT-clear, no lesions, no postnasal drip or exudate noted.   NECK:  Supple w/ fair ROM; no JVD; normal carotid impulses w/o bruits; no thyromegaly or nodules palpated; no lymphadenopathy.    RESP  Clear  P & A; w/o, wheezes/ rales/ or rhonchi. no accessory muscle use, no dullness to percussion  CARD:  RRR, no m/r/g, no peripheral edema, pulses intact, no cyanosis or clubbing.  GI:   Soft & nt; nml bowel sounds; no organomegaly or masses detected.   Musco: Warm bil, no deformities or joint swelling noted.   Neuro: alert, no focal deficits noted.    Skin: Warm, no lesions or rashes    Lab Results:   BNP No results found for: BNP  ProBNP No results found for: PROBNP  Imaging: No results found.    No flowsheet data found.  No results found for: NITRICOXIDE      Assessment & Plan:   Primary insomnia Chronic insomnia with perceived clinical benefit with Ambien. Home sleep study negative for sleep apnea. Patient education on healthy sleep regimen.   Plan  Patient Instructions  Healthy sleep regimen  Ambien At bedtime  As needed  Insomnia .  Follow up with Dr Ander Slade in 1 year and As needed       Obesity Healthy weight loss     Rexene Edison, NP 04/02/2020

## 2020-04-02 NOTE — Patient Instructions (Signed)
Healthy sleep regimen  Ambien At bedtime  As needed  Insomnia .  Follow up with Dr Wynona Neat in 1 year and As needed

## 2020-04-02 NOTE — Assessment & Plan Note (Signed)
Healthy weight loss 

## 2020-04-02 NOTE — Assessment & Plan Note (Addendum)
Chronic insomnia with perceived clinical benefit with Ambien. Home sleep study negative for sleep apnea. Patient education on healthy sleep regimen. Patient education on sedating effect of Ambien and to use with caution.  Also not to combine other sedating medications.   Plan  Patient Instructions  Healthy sleep regimen  Ambien At bedtime  As needed  Insomnia .  Follow up with Paul Parsons in 1 year and As needed

## 2020-04-15 ENCOUNTER — Encounter: Payer: Managed Care, Other (non HMO) | Admitting: Internal Medicine

## 2020-05-02 ENCOUNTER — Other Ambulatory Visit: Payer: Self-pay

## 2020-05-02 DIAGNOSIS — F5104 Psychophysiologic insomnia: Secondary | ICD-10-CM

## 2020-05-02 NOTE — Telephone Encounter (Signed)
Received faxed refill request from pharmacy  Medication name/strength/dose: zolpidem 10 mg Medication last rx'd: 04/02/20 Quantity and number of refills last rx'd: #30 with no refills Instructions: take one tablet by mouth at bedtime as needed for sleep  Last OV: 04/02/2020 Next OV: 04/08/2021  TP  please advise on refill request  Allergies  Allergen Reactions  . Tetracyclines & Related   . Zithromax [Azithromycin Dihydrate]    Current Outpatient Medications on File Prior to Visit  Medication Sig Dispense Refill  . clonazePAM (KLONOPIN) 1 MG tablet TAKE 1/2 - 1 TABLET BY MOUTH ONCE DAILY AS NEEDED 30 tablet 5  . Melatonin 10 MG TABS Take 10 mg by mouth daily as needed.     Marland Kitchen PRESCRIPTION MEDICATION Ibgard 1 2x daily    . zolpidem (AMBIEN) 10 MG tablet TAKE 1 TABLET BY MOUTH AT BEDTIME AS NEEDED FOR SLEEP 30 tablet 0   No current facility-administered medications on file prior to visit.

## 2020-05-04 ENCOUNTER — Other Ambulatory Visit: Payer: Self-pay | Admitting: Adult Health

## 2020-05-04 ENCOUNTER — Other Ambulatory Visit: Payer: Self-pay

## 2020-05-04 DIAGNOSIS — F5104 Psychophysiologic insomnia: Secondary | ICD-10-CM

## 2020-05-04 NOTE — Telephone Encounter (Signed)
Pt calling for refill of the ambien.    Pt was last seen by TP on 03/17 and given  A refill that day for #30 with no additional refills.  TP is out of the office this week. SG please advise on refill of medication.  Thanks

## 2020-05-05 NOTE — Telephone Encounter (Signed)
Duplicate message - closed

## 2020-05-05 NOTE — Telephone Encounter (Signed)
Dr.Olalere message was routed to Tammy who is out of office. Pt requesting refill of Ambien Last refill 3/17 as well as last ov.   Assessment & Plan:   Primary insomnia Chronic insomnia with perceived clinical benefit with Ambien. Home sleep study negative for sleep apnea. Patient education on healthy sleep regimen.   Plan  Patient Instructions  Healthy sleep regimen  Ambien At bedtime  As needed  Insomnia .  Follow up with Dr Wynona Neat in 1 year and As needed

## 2020-05-05 NOTE — Telephone Encounter (Signed)
Assessment & Plan:  04/02/20 last visit: Primary insomnia Chronic insomnia with perceived clinical benefit with Ambien. Home sleep study negative for sleep apnea. Patient education on healthy sleep regimen.   Plan  Patient Instructions  Healthy sleep regimen  Ambien At bedtime  As needed  Insomnia .  Follow up with Dr Wynona Neat in 1 year and As needed

## 2020-05-05 NOTE — Telephone Encounter (Signed)
Did not see this yesterday. Please send to APP/ Doc  of the day as I am in the hospital today. Thanks

## 2020-05-07 ENCOUNTER — Encounter: Payer: Managed Care, Other (non HMO) | Admitting: Internal Medicine

## 2020-05-12 ENCOUNTER — Ambulatory Visit (INDEPENDENT_AMBULATORY_CARE_PROVIDER_SITE_OTHER): Payer: Managed Care, Other (non HMO) | Admitting: Internal Medicine

## 2020-05-12 ENCOUNTER — Encounter: Payer: Self-pay | Admitting: Internal Medicine

## 2020-05-12 ENCOUNTER — Other Ambulatory Visit: Payer: Self-pay

## 2020-05-12 VITALS — BP 126/78 | HR 84 | Temp 98.6°F | Ht 66.0 in | Wt 291.0 lb

## 2020-05-12 DIAGNOSIS — Z23 Encounter for immunization: Secondary | ICD-10-CM | POA: Diagnosis not present

## 2020-05-12 DIAGNOSIS — F419 Anxiety disorder, unspecified: Secondary | ICD-10-CM | POA: Diagnosis not present

## 2020-05-12 DIAGNOSIS — Z1159 Encounter for screening for other viral diseases: Secondary | ICD-10-CM | POA: Diagnosis not present

## 2020-05-12 DIAGNOSIS — Z0001 Encounter for general adult medical examination with abnormal findings: Secondary | ICD-10-CM

## 2020-05-12 DIAGNOSIS — F5101 Primary insomnia: Secondary | ICD-10-CM | POA: Diagnosis not present

## 2020-05-12 NOTE — Patient Instructions (Signed)
You had the Tdap tetanus shot today   You will be contacted regarding the referral for: counseling  Please continue all other medications as before, and refills have been done if requested.  Please have the pharmacy call with any other refills you may need.  Please continue your efforts at being more active, low cholesterol diet, and weight control.  You are otherwise up to date with prevention measures today.  Please keep your appointments with your specialists as you may have planned  Please take the order form to [facility] for lab testing  Please make an Appointment to return for your 1 year visit, or sooner if needed

## 2020-05-12 NOTE — Progress Notes (Signed)
Patient ID: Paul Parsons, male   DOB: [birth year], 25 y.o.   MRN: [id number]         Chief Complaint:: wellness exam and Annual Exam  anxiety       HPI:  Paul Parsons is a 25 y.o. male here for wellness exam, due for Tdap o/w up to date with preventive referrals and immunizations                        Also has ongoing anxiety, somewhat improved on current tx, but feels counseling would benefit as well. Pt currently working on his masters in psychology.  All labs to come from [place].  Pt denies chest pain, increased sob or doe, wheezing, orthopnea, PND, increased LE swelling, palpitations, dizziness or syncope.   Pt denies polydipsia, polyuria, or new focal neuro s/s.  Denies worsening depressive symptoms, suicidal ideation, or panic; has ongoing anxiety, not increased recently.   Pt denies fever, wt loss, night sweats, loss of appetite, or other constitutional symptoms  No other new complaints   Wt Readings from Last 3 Encounters:  05/12/20 291 lb (132 kg)  04/02/20 285 lb (129.3 kg)  03/05/20 287 lb 3.2 oz (130.3 kg)   BP Readings from Last 3 Encounters:  05/12/20 126/78  04/02/20 134/84  03/05/20 110/80   Immunization History  Administered Date(s) Administered  . DTaP 09/03/1996, 11/08/1996, 01/07/1997, 12/29/1997, 07/10/2001  . HPV Quadrivalent 11/19/2013, 12/20/2013  . Hepatitis A 09/30/2005, 09/14/2006  . Hepatitis B 1996-02-03, 08/09/1996, 04/09/1997  . HiB (PRP-OMP) 09/03/1996, 11/08/1996, 01/07/1997, 10/07/1997  . IPV 09/03/1996, 11/08/1996, 12/29/1997, 07/10/2001  . Influenza,inj,Quad PF,6+ Mos 10/29/2017, 11/07/2018, 10/18/2019  . Influenza-Unspecified 10/23/2003, 10/12/2004, 11/09/2005, 11/11/2006, 10/21/2008, 09/07/2009, 10/03/2012, 11/17/2013  . MMR 10/07/1997, 07/10/2001  . Meningococcal Conjugate 09/30/2005, 10/03/2012  . Moderna SARS-COV2 Booster Vaccination 01/01/2020  . Moderna Sars-Covid-2 Vaccination 03/15/2019, 03/19/2019, 04/15/2019, 04/16/2019  .  Pneumococcal-Unspecified 04/16/1998, 07/03/1998  . Td 07/25/2007  . Tdap 07/25/2007, 05/12/2020  . Varicella 07/15/1997, 09/14/2006   Health Maintenance Due  Topic Date Due  . Hepatitis C Screening  Never done      Past Medical History:  Diagnosis Date  . Abdominal pain, recurrent   . Abdominal pain, recurrent   . Asthma   . Chronic constipation    Very large hard stools  . Chronic constipation   . IBS (irritable bowel syndrome)    Past Surgical History:  Procedure Laterality Date  . CHOLECYSTECTOMY    . ERCP  2012  . MOUTH SURGERY      x 2 , wisdom teeth    reports that he has never smoked. He has never used smokeless tobacco. He reports that he does not drink alcohol and does not use drugs. family history includes Breast cancer in his mother. Allergies  Allergen Reactions  . Tetracyclines & Related   . Zithromax [Azithromycin Dihydrate]    Current Outpatient Medications on File Prior to Visit  Medication Sig Dispense Refill  . clonazePAM (KLONOPIN) 1 MG tablet TAKE 1/2 - 1 TABLET BY MOUTH ONCE DAILY AS NEEDED 30 tablet 5  . Melatonin 10 MG TABS Take 10 mg by mouth daily as needed.     . zolpidem (AMBIEN) 10 MG tablet TAKE 1 TABLET BY MOUTH EVERY DAY AT BEDTIME AS NEEDED FOR SLEEP 30 tablet 1  . PRESCRIPTION MEDICATION Ibgard 1 2x daily     No current facility-administered medications on file prior to visit.  ROS:  All others reviewed and negative.  Objective        PE:  BP 126/78 (BP Location: Left Arm, Patient Position: Sitting, Cuff Size: Large)   Pulse 84   Temp 98.6 F (37 C) (Oral)   Ht 5' 6" (1.676 m)   Wt 291 lb (132 kg)   SpO2 96%   BMI 46.97 kg/m                 Constitutional: Pt appears in NAD               HENT: Head: NCAT.                Right Ear: External ear normal.                 Left Ear: External ear normal.                Eyes: . Pupils are equal, round, and reactive to light. Conjunctivae and EOM are normal                Nose: without d/c or deformity               Neck: Neck supple. Gross normal ROM               Cardiovascular: Normal rate and regular rhythm.                 Pulmonary/Chest: Effort normal and breath sounds without rales or wheezing.                Abd:  Soft, NT, ND, + BS, no organomegaly               Neurological: Pt is alert. At baseline orientation, motor grossly intact               Skin: Skin is warm. No rashes, no other new lesions, LE edema - none               Psychiatric: Pt behavior is normal without agitation , nervous 1-2+  Micro: none  Cardiac tracings I have personally interpreted today:  none  Pertinent Radiological findings (summarize): none   Lab Results  Component Value Date   WBC 10.6 09/22/2017   HGB 15.9 09/22/2017   HCT 47.5 09/22/2017   PLT 348 09/22/2017   GLUCOSE 91 09/22/2017   CHOL 153 09/22/2017   TRIG 115 09/22/2017   HDL 50 09/22/2017   LDLCALC 80 09/22/2017   ALT 32 09/22/2017   AST 21 09/22/2017   NA 139 09/22/2017   K 4.5 09/22/2017   CL 98 09/22/2017   CREATININE 1.11 09/22/2017   BUN 21 (H) 09/22/2017   CO2 24 09/22/2017   TSH 1.710 09/22/2017   Assessment/Plan:  Paul Parsons is a 25 y.o. White or Caucasian [1] male with  has a past medical history of Abdominal pain, recurrent, Abdominal pain, recurrent, Asthma, Chronic constipation, Chronic constipation, and IBS (irritable bowel syndrome).  Encounter for well adult exam with abnormal findings Age and sex appropriate education and counseling updated with regular exercise and diet Referrals for preventative services - none needed Immunizations addressed - for Tdap Smoking counseling  - none needed Evidence for depression or other mood disorder - for counseling referral Most recent labs reviewed. I have personally reviewed and have noted: 1) the patient's medical and social history 2) The patient's current medications and supplements 3) The patient's height, weight, and BMI  have  been recorded in the chart   Anxiety Mild to mod persistent, cont klonopin prn, also refer cousnseling   Primary insomnia Stable, to cont melatonin and ambien qhs prn  Followup: Return in about 1 year (around 05/12/2021).  Cathlean Cower, MD 05/18/2020 8:54 PM [facility]

## 2020-05-18 ENCOUNTER — Encounter: Payer: Self-pay | Admitting: Internal Medicine

## 2020-05-18 NOTE — Assessment & Plan Note (Signed)
Mild to mod persistent, cont klonopin prn, also refer cousnseling

## 2020-05-18 NOTE — Assessment & Plan Note (Addendum)
Stable, to cont melatonin and ambien qhs prn

## 2020-05-18 NOTE — Assessment & Plan Note (Signed)
Age and sex appropriate education and counseling updated with regular exercise and diet Referrals for preventative services - none needed Immunizations addressed - for Tdap Smoking counseling  - none needed Evidence for depression or other mood disorder - for counseling referral Most recent labs reviewed. I have personally reviewed and have noted: 1) the patient's medical and social history 2) The patient's current medications and supplements 3) The patient's height, weight, and BMI have been recorded in the chart

## 2020-06-18 ENCOUNTER — Encounter: Payer: Self-pay | Admitting: Internal Medicine

## 2020-06-19 MED ORDER — CLONAZEPAM 1 MG PO TABS: ORAL_TABLET | ORAL | 5 refills | Status: DC

## 2020-07-06 ENCOUNTER — Other Ambulatory Visit: Payer: Self-pay | Admitting: Pulmonary Disease

## 2020-07-06 DIAGNOSIS — F5104 Psychophysiologic insomnia: Secondary | ICD-10-CM

## 2020-07-06 NOTE — Telephone Encounter (Signed)
Patient is asking for a refill on his Ambien 10mg . He was last seen on 04/02/20 by TP. He was advised to follow up in 1 year. His last refill was on 05/05/20 for 30 tablets with 1 refill.   AO, please advise if you are ok with this refill. Thanks!

## 2020-09-04 ENCOUNTER — Other Ambulatory Visit: Payer: Self-pay | Admitting: Pulmonary Disease

## 2020-09-04 DIAGNOSIS — F5104 Psychophysiologic insomnia: Secondary | ICD-10-CM

## 2020-09-04 NOTE — Telephone Encounter (Signed)
Call made to patient, confirmed DOB. Requesting refill of Ambien 10mg .   Last OV:04/02/20 Last filled:07/07/20  PM would you be willing to refill ambien since AO is on vacation. Thanks :) Medication has been pended.

## 2020-09-04 NOTE — Telephone Encounter (Signed)
Pt stated that they are needing a refill for the Ambien 10 MG.   Pharmacy;   [phone] - [place], [place] - [address] AT [address] OF [facility] [address]    Pls regard; [phone]

## 2020-09-07 ENCOUNTER — Other Ambulatory Visit: Payer: Self-pay | Admitting: Pulmonary Disease

## 2020-09-07 ENCOUNTER — Telehealth: Payer: Self-pay | Admitting: Adult Health

## 2020-09-07 NOTE — Telephone Encounter (Signed)
Received the following message from patient:  "Hello!  I do not have the option of sending a message to Dr. Wynona Neat on here anymore, so I will send it here. I have called on Friday, the first day available to have my prescription for ambien refilled, and it has yet to be filled.  I called again today and I have yet to here a response yet. I was told Dr. Wynona Neat is on vacation and another Doctor will send a prescription over, but this has not happened yet. I hope this can get solved today!!"  AO, please advise if you are ok with refilling this for him. Thanks!

## 2020-09-07 NOTE — Telephone Encounter (Signed)
See Mychart message from 09/07/20. Closing encounter.

## 2020-10-13 NOTE — Telephone Encounter (Signed)
Ambien refilled on 8/22. Will close encounter.

## 2020-12-03 ENCOUNTER — Other Ambulatory Visit: Payer: Self-pay | Admitting: Pulmonary Disease

## 2020-12-03 DIAGNOSIS — F5104 Psychophysiologic insomnia: Secondary | ICD-10-CM

## 2020-12-04 MED ORDER — ZOLPIDEM TARTRATE 10 MG PO TABS: ORAL_TABLET | ORAL | 2 refills | Status: DC

## 2020-12-04 NOTE — Telephone Encounter (Signed)
ambien refilled 

## 2020-12-14 ENCOUNTER — Encounter: Payer: Self-pay | Admitting: Internal Medicine

## 2020-12-14 ENCOUNTER — Other Ambulatory Visit: Payer: Self-pay | Admitting: Internal Medicine

## 2021-03-03 ENCOUNTER — Other Ambulatory Visit: Payer: Self-pay | Admitting: Pulmonary Disease

## 2021-03-03 DIAGNOSIS — F5104 Psychophysiologic insomnia: Secondary | ICD-10-CM

## 2021-03-04 ENCOUNTER — Telehealth: Payer: Self-pay | Admitting: Adult Health

## 2021-03-04 DIAGNOSIS — F5104 Psychophysiologic insomnia: Secondary | ICD-10-CM

## 2021-03-04 MED ORDER — ZOLPIDEM TARTRATE 10 MG PO TABS: 10.0000 mg | ORAL_TABLET | Freq: Every evening | ORAL | 0 refills | Status: DC | PRN

## 2021-03-04 NOTE — Telephone Encounter (Signed)
Rx refilled x 1 only , advise not to take with Klonopin  PMP reviewed

## 2021-03-04 NOTE — Telephone Encounter (Signed)
Called and spoke with patient and let him know that we refilled his medications and to not take it with clonapin. No questions noted from patient. Nothing further

## 2021-03-04 NOTE — Telephone Encounter (Signed)
Spoke to patient, who is requesting refill on Ambien 10mg .  Last refilled 12/04/2021 #30 with 0 refills.  Last OV 04/02/2020 with pending appt 04/08/21. [facility]  Tammy, please advise. Thanks

## 2021-03-04 NOTE — Telephone Encounter (Signed)
Refill sent earlier today. See phone note 02/16.

## 2021-03-12 ENCOUNTER — Telehealth: Payer: Self-pay

## 2021-03-12 ENCOUNTER — Encounter: Payer: Self-pay | Admitting: Internal Medicine

## 2021-03-12 ENCOUNTER — Other Ambulatory Visit: Payer: Self-pay | Admitting: Internal Medicine

## 2021-03-12 MED ORDER — CLONAZEPAM 1 MG PO TABS: 1.0000 mg | ORAL_TABLET | Freq: Every day | ORAL | 2 refills | Status: DC | PRN

## 2021-03-12 NOTE — Telephone Encounter (Signed)
Pt checking status of refill request, pt states he is out of medication and was hoping to have it filled by today  Advised pt refill request may take up to 3bds

## 2021-03-12 NOTE — Telephone Encounter (Signed)
Pt is requesting a refill on: clonazePAM (KLONOPIN) 1 MG tablet  [facility]acy[id number] - [place], [place] - [address] AT [address] [facility] [address]  LOV 4/22  Pt CB [phone]

## 2021-04-02 ENCOUNTER — Telehealth: Payer: Self-pay | Admitting: Pulmonary Disease

## 2021-04-02 ENCOUNTER — Other Ambulatory Visit: Payer: Self-pay | Admitting: Adult Health

## 2021-04-02 DIAGNOSIS — F5104 Psychophysiologic insomnia: Secondary | ICD-10-CM

## 2021-04-02 MED ORDER — ZOLPIDEM TARTRATE 10 MG PO TABS: 10.0000 mg | ORAL_TABLET | Freq: Every evening | ORAL | 0 refills | Status: DC | PRN

## 2021-04-02 NOTE — Telephone Encounter (Signed)
Ambien filled. Pt notified. Nothing further needed  ?

## 2021-04-02 NOTE — Telephone Encounter (Signed)
Yes I will refill x 1 , needs OV , LOV >1 year ago . Patient of Dr. Ander Slade .  ?PMP reviewed  ?

## 2021-04-08 ENCOUNTER — Ambulatory Visit: Payer: Managed Care, Other (non HMO) | Admitting: Adult Health

## 2021-04-12 ENCOUNTER — Encounter: Payer: Self-pay | Admitting: Adult Health

## 2021-04-12 ENCOUNTER — Other Ambulatory Visit: Payer: Self-pay

## 2021-04-12 ENCOUNTER — Ambulatory Visit: Payer: Managed Care, Other (non HMO) | Admitting: Adult Health

## 2021-04-12 DIAGNOSIS — F5101 Primary insomnia: Secondary | ICD-10-CM

## 2021-04-12 DIAGNOSIS — E669 Obesity, unspecified: Secondary | ICD-10-CM

## 2021-04-12 NOTE — Assessment & Plan Note (Addendum)
Chronic insomnia.  Patient has tried multiple things in the past with only Ambien helping his sleep.  Healthy sleep regimen discussed.  Long discussion regarding chronic use of sleep medications.  We talked about decreasing his dose or even try nights where he does not take this to see if he can manage without. ?PMP was reviewed ? ?Plan  ?Patient Instructions  ?Healthy sleep regimen  ?Ambien At bedtime  As needed  Insomnia .  ?Use caution with sedating medications.  ?Do not combine sedating medication  ?Do not drive if sleepy.  ?Refer to healthy weight and wellness.  ?Follow up with Dr Wynona Neat in 1 year and As needed   ?  ? ?

## 2021-04-12 NOTE — Assessment & Plan Note (Signed)
Healthy weight loss discussed.  Referral to healthy weight and wellness 

## 2021-04-12 NOTE — Progress Notes (Signed)
? ?'@Patient'$  ID: Paul Parsons, male    DOB: 1995-10-21, 26 y.o.   MRN: [id number] ? ?Chief Complaint  ?Patient presents with  ? Follow-up  ? ? ?Referring provider: ?Biagio Borg, MD ? 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  ? ?TEST/EVENTS :  ?Home sleep study March 25, 2020  negative for sleep apnea ? ?04/12/2021 Follow up : Chronic insomnia  ?Patient presents for a 1 year follow-up.  Patient is followed for chronic insomnia.  Patient has tried multiple medications over the last years for insomnia.  He has trouble going to sleep and staying asleep at times.  His most effective regimen has been Ambien.  Patient was set up for a sleep study previously and March 2022 that was negative for sleep apnea.  ?Patient has been educated on healthy sleep regimen and alternatives to help with sleep such as stretching, yoga, melatonin and stress reducers. ?He is here today for refill of his Ambien.  ?Does take on occasion of Klonopin 1/2 tab some days, will take early in daytime for anxiety .  ?Feels since starting to work fulltime his sleep regimen is much better.  ?Remains active, walks dog daily for 1 hour.  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  ?PMP reviewed .  ? ?Dad had MI recently, doing well.  ? ?Has a bulldog, likes to go to dog park.  ? ?Weight continues to trend up, Now at 305lbs. We discussed healthy weight loss.  Patient says he is wanting to lose weight.  We discussed healthy diet exercise and walking.  We discussed a referral to healthy weight and wellness. ? ? ? ?Allergies  ?Allergen Reactions  ? Tetracyclines & Related   ? Zithromax [Azithromycin Dihydrate]   ? ? ?Immunization History  ?Administered Date(s) Administered  ? DTaP 09/03/1996, 11/08/1996, 01/07/1997, 12/29/1997, 07/10/2001  ? HPV Quadrivalent 11/19/2013, 12/20/2013  ? Hepatitis A  09/30/2005, 09/14/2006  ? Hepatitis B April [birth year], 1996, 08/09/1996, 04/09/1997  ? HiB (PRP-OMP) 09/03/1996, 11/08/1996, 01/07/1997, 10/07/1997  ? IPV 09/03/1996, 11/08/1996, 12/29/1997, 07/10/2001  ? Influenza,inj,Quad PF,6+ Mos 10/29/2017, 11/07/2018, 10/18/2019, 11/11/2020  ? Influenza-Unspecified 10/23/2003, 10/12/2004, 11/09/2005, 11/11/2006, 10/21/2008, 09/07/2009, 10/03/2012, 11/17/2013  ? MMR 10/07/1997, 07/10/2001  ? Meningococcal Conjugate 09/30/2005, 10/03/2012  ? Moderna SARS-COV2 Booster Vaccination 01/01/2020  ? Moderna Sars-Covid-2 Vaccination 03/15/2019, 03/19/2019, 04/15/2019, 04/16/2019  ? PFIZER(Purple Top)SARS-COV-2 Vaccination 11/11/2020  ? Pneumococcal-Unspecified 04/16/1998, 07/03/1998  ? Td 07/25/2007  ? Tdap 07/25/2007, 05/12/2020  ? Varicella 07/15/1997, 09/14/2006  ? ? ?Past Medical History:  ?Diagnosis Date  ? Abdominal pain, recurrent   ? Abdominal pain, recurrent   ? Asthma   ? Chronic constipation   ? Very large hard stools  ? Chronic constipation   ? IBS (irritable bowel syndrome)   ? ? ?Tobacco History: ?Social History  ? ?Tobacco Use  ?Smoking Status Never  ?Smokeless Tobacco Never  ? ?Counseling given: Not Answered ? ? ?Outpatient Medications Prior to Visit  ?Medication Sig Dispense Refill  ? clonazePAM (KLONOPIN) 1 MG tablet TAKE 1 TABLET BY MOUTH EVERY DAY AS NEEDED 30 tablet 2  ? clonazePAM (KLONOPIN) 1 MG tablet Take 1 tablet (1 mg total) by mouth daily as needed for anxiety. 30 tablet 2  ? Melatonin 10 MG TABS Take 10 mg by mouth daily as needed.     ? PRESCRIPTION MEDICATION Ibgard 1 2x daily    ?  zolpidem (AMBIEN) 10 MG tablet Take 1 tablet (10 mg total) by mouth at bedtime as needed for sleep. 30 tablet 0  ? zolpidem (AMBIEN) 10 MG tablet Take 1 tablet (10 mg total) by mouth at bedtime as needed for sleep. 30 tablet 0  ? ?No facility-administered medications prior to visit.  ? ? ? ?Review of Systems:  ? ?Constitutional:   No  weight loss, night sweats,  Fevers, chills, fatigue, or   lassitude. ? ?HEENT:   No headaches,  Difficulty swallowing,  Tooth/dental problems, or  Sore throat,  ?              No sneezing, itching, ear ache, nasal congestion, post nasal drip,  ? ?CV:  No chest pain,  Orthopnea, PND, swelling in lower extremities, anasarca, dizziness, palpitations, syncope.  ? ?GI  No heartburn, indigestion, abdominal pain, nausea, vomiting, diarrhea, change in bowel habits, loss of appetite, bloody stools.  ? ?Resp: No shortness of breath with exertion or at rest.  No excess mucus, no productive cough,  No non-productive cough,  No coughing up of blood.  No change in color of mucus.  No wheezing.  No chest wall deformity ? ?Skin: no rash or lesions. ? ?GU: no dysuria, change in color of urine, no urgency or frequency.  No flank pain, no hematuria  ? ?MS:  No joint pain or swelling.  No decreased range of motion.  No back pain. ? ? ? ?Physical Exam ? ?BP 130/80 (BP Location: Left Arm, Patient Position: Sitting, Cuff Size: Large)   Pulse 85   Temp 98.1 ?F (36.7 ?C) (Oral)   Ht $R'5\' 6"'Kh$  (1.676 m)   Wt (!) 305 lb (138.3 kg)   SpO2 96%   BMI 49.23 kg/m?  ? ?GEN: A/Ox3; pleasant , NAD, well nourished  ?  ?HEENT:  [place]/AT,   NOSE-clear, THROAT-clear, no lesions, no postnasal drip or exudate noted.  ? ?NECK:  Supple w/ fair ROM; no JVD; normal carotid impulses w/o bruits; no thyromegaly or nodules palpated; no lymphadenopathy.   ? ?RESP  Clear  P & A; w/o, wheezes/ rales/ or rhonchi. no accessory muscle use, no dullness to percussion ? ?CARD:  RRR, no m/r/g, no peripheral edema, pulses intact, no cyanosis or clubbing. ? ?GI:   Soft & nt; nml bowel sounds; no organomegaly or masses detected.  ? ?Musco: Warm bil, no deformities or joint swelling noted.  ? ?Neuro: alert, no focal deficits noted.   ? ?Skin: Warm, no lesions or rashes ? ? ? ?Lab Results: ? ? ? ?BMET ? ? ?BNP ?No results found for: BNP ? ?ProBNP ?No results found for: PROBNP ? ?Imaging: ?No results found. ? ? ? ?   ? View : No data to  display.  ?  ?  ?  ? ? ?No results found for: NITRICOXIDE ? ? ? ? ? ?Assessment & Plan:  ? ?Primary insomnia ?Chronic insomnia.  Patient has tried multiple things in the past with only Ambien helping his sleep.  Healthy sleep regimen discussed.  Long discussion regarding chronic use of sleep medications.  We talked about decreasing his dose or even try nights where he does not take this to see if he can manage without. ?PMP was reviewed ? ?Plan  ?Patient Instructions  ?Healthy sleep regimen  ?Ambien At bedtime  As needed  Insomnia .  ?Use caution with sedating medications.  ?Do not combine sedating medication  ?Do not drive if sleepy.  ?Refer to healthy  weight and wellness.  ?Follow up with Dr Ander Slade in 1 year and As needed   ?  ? ? ?Obesity ?Healthy weight loss discussed.  Referral to healthy weight and wellness ? ? ? ? ?Rexene Edison, NP ?04/12/2021 ? ?

## 2021-04-12 NOTE — Patient Instructions (Addendum)
Healthy sleep regimen  ?Ambien At bedtime  As needed  Insomnia .  ?Use caution with sedating medications.  ?Do not combine sedating medication  ?Do not drive if sleepy.  ?Refer to healthy weight and wellness.  ?Follow up with Dr Wynona Neat in 1 year and As needed   ?

## 2021-04-13 NOTE — Addendum Note (Signed)
Addended by: Delrae Rend on: 04/13/2021 11:53 AM ? ? Modules accepted: Orders ? ?

## 2021-05-03 ENCOUNTER — Other Ambulatory Visit: Payer: Self-pay | Admitting: Pulmonary Disease

## 2021-05-03 ENCOUNTER — Telehealth: Payer: Self-pay | Admitting: Adult Health

## 2021-05-03 ENCOUNTER — Other Ambulatory Visit: Payer: Self-pay | Admitting: Adult Health

## 2021-05-03 MED ORDER — ZOLPIDEM TARTRATE 10 MG PO TABS: 10.0000 mg | ORAL_TABLET | Freq: Every evening | ORAL | 0 refills | Status: DC | PRN

## 2021-05-03 NOTE — Telephone Encounter (Signed)
Dr. O, please advise. 

## 2021-05-03 NOTE — Telephone Encounter (Signed)
See my chart encounter.

## 2021-05-03 NOTE — Telephone Encounter (Signed)
Request was sent to Dr. Val Eagle 05/03/21 after pt sent this in as a mychart refill request. Routing to Dr. Val Eagle. Please advise. ?

## 2021-05-08 ENCOUNTER — Telehealth (HOSPITAL_COMMUNITY): Payer: Self-pay | Admitting: Family Medicine

## 2021-05-08 ENCOUNTER — Other Ambulatory Visit: Payer: Self-pay | Admitting: Nurse Practitioner

## 2021-05-08 ENCOUNTER — Ambulatory Visit (HOSPITAL_COMMUNITY)
Admission: EM | Admit: 2021-05-08 | Discharge: 2021-05-08 | Disposition: A | Discharge status: Home / Self Care | Payer: Managed Care, Other (non HMO) | Attending: Nurse Practitioner | Admitting: Nurse Practitioner

## 2021-05-08 DIAGNOSIS — U071 COVID-19: Secondary | ICD-10-CM | POA: Diagnosis not present

## 2021-05-08 DIAGNOSIS — Z8709 Personal history of other diseases of the respiratory system: Secondary | ICD-10-CM | POA: Diagnosis not present

## 2021-05-08 MED ORDER — MOLNUPIRAVIR EUA 200MG CAPSULE: 4.0000 | ORAL_CAPSULE | Freq: Two times a day (BID) | ORAL | 0 refills | Status: DC

## 2021-05-08 MED ORDER — ALBUTEROL SULFATE (5 MG/ML) 0.5% IN NEBU: 2.5000 mg | INHALATION_SOLUTION | Freq: Four times a day (QID) | RESPIRATORY_TRACT | 0 refills | Status: DC | PRN

## 2021-05-08 MED ORDER — ALBUTEROL SULFATE (2.5 MG/3ML) 0.083% IN NEBU: 2.5000 mg | INHALATION_SOLUTION | Freq: Four times a day (QID) | RESPIRATORY_TRACT | 0 refills | Status: DC | PRN

## 2021-05-08 MED ORDER — BENZONATATE 100 MG PO CAPS: ORAL_CAPSULE | ORAL | 0 refills | Status: DC

## 2021-05-08 MED ORDER — MOLNUPIRAVIR EUA 200MG CAPSULE: 4.0000 | ORAL_CAPSULE | Freq: Two times a day (BID) | ORAL | 0 refills | Status: AC

## 2021-05-08 NOTE — Discharge Instructions (Signed)
  Some things that can make you feel better are: - Increased rest - Increasing fluid with water/sugar free electrolytes - Acetaminophen and ibuprofen as needed for fever/pain.  - Salt water gargling, chloraseptic spray and throat lozenges - OTC guaifenesin (Mucinex).  - Saline sinus flushes or a neti pot.  - Humidifying the air.  

## 2021-05-08 NOTE — ED Triage Notes (Signed)
Pt tested +for covid this morning. C/o fever and cough. He is taking mucinex. ?

## 2021-05-08 NOTE — Telephone Encounter (Signed)
Reports trouble getting meds. ?Rx printed for him and given to him by RN. ?

## 2021-05-08 NOTE — ED Provider Notes (Signed)
?[facility] ? ? ? ?CSN: [id number] ?Arrival date & time: 05/08/21  1107 ? ? ?  ? ?History   ?Chief Complaint ?Chief Complaint  ?Patient presents with  ? Cough  ? Nasal Congestion  ? ? ?HPI ?Paul Parsons is a 26 y.o. male.  ? ?Patient presents with 1 day of upper respiratory symptoms.  He reports his dad tested positive for COVID 3 days ago, mom tested positive last night, he tested positive this morning for COVID-19. ? ?He reports chest congestion, shortness of breath, body aches/chills, congested cough, postnasal drainage, sore throat with cough, headache, and fever at home.  He denies chest pain or tightness, nausea/vomiting, diarrhea, change in appetite.  He denies any new rash on his skin.  He has not taken anything for symptoms yet. ? ?Patient reports history of asthma that is worse when he has upper respiratory infections, he has nebulizer at home but no medicine for it. ? ? ?Past Medical History:  ?Diagnosis Date  ? Abdominal pain, recurrent   ? Abdominal pain, recurrent   ? Asthma   ? Chronic constipation   ? Very large hard stools  ? Chronic constipation   ? IBS (irritable bowel syndrome)   ? ? ?Patient Active Problem List  ? Diagnosis Date Noted  ? Hypersomnolence 12/18/2017  ? Anxiety 08/29/2016  ? Obesity 08/26/2016  ? Allergic rhinitis 08/26/2016  ? Hematochezia 12/29/2015  ? Encounter for well adult exam with abnormal findings 06/24/2015  ? Primary insomnia 03/07/2014  ? Abdominal pain   ? ? ?Past Surgical History:  ?Procedure Laterality Date  ? CHOLECYSTECTOMY    ? ERCP  2012  ? MOUTH SURGERY    ?  x 2 , wisdom teeth  ? ? ? ? ? ?Home Medications   ? ?Prior to Admission medications   ?Medication Sig Start Date End Date Taking? Authorizing Provider  ?albuterol (PROVENTIL) (5 MG/ML) 0.5% nebulizer solution Take 0.5 mLs (2.5 mg total) by nebulization every 6 (six) hours as needed for wheezing or shortness of breath. 05/08/21  Yes Valentino NoseMartinez, Alvester Eads A, NP  ?molnupiravir EUA (LAGEVRIO) 200 mg CAPS  capsule Take 4 capsules (800 mg total) by mouth 2 (two) times daily for 5 days. 05/08/21 05/13/21 Yes Valentino NoseMartinez, Cecil Vandyke A, NP  ?clonazePAM (KLONOPIN) 1 MG tablet TAKE 1 TABLET BY MOUTH EVERY DAY AS NEEDED 12/14/20   Corwin LevinsJohn, [name], MD  ?clonazePAM (KLONOPIN) 1 MG tablet Take 1 tablet (1 mg total) by mouth daily as needed for anxiety. 03/12/21 03/12/22  Corwin LevinsJohn, [name], MD  ?Melatonin 10 MG TABS Take 10 mg by mouth daily as needed.     [provider]  ?PRESCRIPTION MEDICATION Ibgard 1 2x daily    [provider]  ?zolpidem (AMBIEN) 10 MG tablet Take 1 tablet (10 mg total) by mouth at bedtime as needed for sleep. 03/04/21   Parrett, Virgel Bouquetammy S, NP  ?zolpidem (AMBIEN) 10 MG tablet Take 1 tablet (10 mg total) by mouth at bedtime as needed for sleep. 05/03/21 06/02/21  Tomma Lightninglalere, Adewale A, MD  ? ? ?Family History ?Family History  ?Problem Relation Age of Onset  ? Breast cancer Mother   ?     BREAST  ? Alcohol abuse Neg Hx   ? Diabetes Neg Hx   ? Drug abuse Neg Hx   ? Early death Neg Hx   ? Heart disease Neg Hx   ? Hyperlipidemia Neg Hx   ? Hypertension Neg Hx   ? Kidney disease  Neg Hx   ? Stroke Neg Hx   ? Colon cancer Neg Hx   ? Stomach cancer Neg Hx   ? ? ?Social History ?Social History  ? ?Tobacco Use  ? Smoking status: Never  ? Smokeless tobacco: Never  ?Vaping Use  ? Vaping Use: Never used  ?Substance Use Topics  ? Alcohol use: No  ?  Alcohol/week: 3.0 standard drinks  ?  Types: 3 Cans of beer per week  ? Drug use: No  ?  Types: Marijuana  ?  Comment: former  ? ? ? ?Allergies   ?Tetracyclines & related and Zithromax [azithromycin dihydrate] ? ? ?Review of Systems ?Review of Systems ?Per HPI ? ?Physical Exam ?Triage Vital Signs ?ED Triage Vitals  ?Enc Vitals Group  ?   BP 05/08/21 1238 116/83  ?   Pulse Rate 05/08/21 1238 (!) 111  ?   Resp 05/08/21 1238 16  ?   Temp 05/08/21 1238 (!) 100.6 ?F (38.1 ?C)  ?   Temp Source 05/08/21 1238 Oral  ?   SpO2 05/08/21 1238 100 %  ?   Weight --   ?   Height --   ?   Head  Circumference --   ?   Peak Flow --   ?   Pain Score 05/08/21 1239 0  ?   Pain Loc --   ?   Pain Edu? --   ?   Excl. in GC? --   ? ?No data found. ? ?Updated Vital Signs ?BP 116/83 (BP Location: Left Arm)   Pulse (!) 111   Temp (!) 100.6 ?F (38.1 ?C) (Oral)   Resp 16   SpO2 100%  ? ?Visual Acuity ?Right Eye Distance:   ?Left Eye Distance:   ?Bilateral Distance:   ? ?Right Eye Near:   ?Left Eye Near:    ?Bilateral Near:    ? ?Physical Exam ?Vitals and nursing note reviewed.  ?Constitutional:   ?   General: He is not in acute distress. ?   Appearance: Normal appearance. He is not ill-appearing or toxic-appearing.  ?HENT:  ?   Head: Normocephalic and atraumatic.  ?   Right Ear: Tympanic membrane, ear canal and external ear normal.  ?   Left Ear: Tympanic membrane, ear canal and external ear normal.  ?   Nose: Rhinorrhea present. No congestion.  ?   Mouth/Throat:  ?   Mouth: Mucous membranes are moist.  ?   Pharynx: Oropharynx is clear. Posterior oropharyngeal erythema present. No oropharyngeal exudate.  ?   Comments: Cobblestoning of posterior pharynx ?Eyes:  ?   General: No scleral icterus. ?   Extraocular Movements: Extraocular movements intact.  ?Cardiovascular:  ?   Rate and Rhythm: Normal rate and regular rhythm.  ?Pulmonary:  ?   Effort: Pulmonary effort is normal. No respiratory distress.  ?   Breath sounds: Normal breath sounds. No wheezing, rhonchi or rales.  ?Abdominal:  ?   General: Abdomen is flat. Bowel sounds are normal. There is no distension.  ?   Palpations: Abdomen is soft.  ?Musculoskeletal:  ?   Cervical back: Normal range of motion and neck supple.  ?Lymphadenopathy:  ?   Cervical: No cervical adenopathy.  ?Skin: ?   General: Skin is warm and dry.  ?   Coloration: Skin is not jaundiced or pale.  ?   Findings: No erythema or rash.  ?Neurological:  ?   Mental Status: He is alert and oriented to person, place,  and time.  ?   Motor: No weakness.  ?Psychiatric:     ?   Behavior: Behavior is  cooperative.  ? ? ? ?UC Treatments / Results  ?Labs ?(all labs ordered are listed, but only abnormal results are displayed) ?Labs Reviewed - No data to display ? ?EKG ? ? ?Radiology ?No results found. ? ?Procedures ?Procedures (including critical care time) ? ?Medications Ordered in UC ?Medications - No data to display ? ?Initial Impression / Assessment and Plan / UC Course  ?I have reviewed the triage vital signs and the nursing notes. ? ?Pertinent labs & imaging results that were available during my care of the patient were reviewed by me and considered in my medical decision making (see chart for details). ? ?  ?COVID-19 test positive at home.  Treat with oral antiviral therapy - molnupiravir as we do not have recent kidney function.  Will also send in a prescription for albuterol nebulizer given history of asthma.  Continue supportive care. Increase fluid intake with water or electrolyte solution like pedialyte. Encouraged acetaminophen as needed for fever/pain. Encouraged salt water gargling, chloraseptic spray and throat lozenges. Encouraged OTC guaifenesin. Encouraged saline sinus flushes and/or neti with humidified air.  Note given for work.  Seek care if symptoms worsen.  If symptoms persist more than 10 days without improvement, follow up with PCP. ? ?Final Clinical Impressions(s) / UC Diagnoses  ? ?Final diagnoses:  ?COVID-19  ?History of asthma  ? ? ? ?Discharge Instructions   ? ?  ?Some things that can make you feel better are: ?- Increased rest ?- Increasing fluid with water/sugar free electrolytes ?- Acetaminophen and ibuprofen as needed for fever/pain.  ?- Salt water gargling, chloraseptic spray and throat lozenges ?- OTC guaifenesin (Mucinex).  ?- Saline sinus flushes or a neti pot.  ?- Humidifying the air. ? ? ? ? ? ?ED Prescriptions   ? ? Medication Sig Dispense Auth. Provider  ? albuterol (PROVENTIL) (5 MG/ML) 0.5% nebulizer solution Take 0.5 mLs (2.5 mg total) by nebulization every 6 (six) hours  as needed for wheezing or shortness of breath. 20 mL Valentino Nose, NP  ? molnupiravir EUA (LAGEVRIO) 200 mg CAPS capsule Take 4 capsules (800 mg total) by mouth 2 (two) times daily for 5 days. 40 capsule Ma

## 2021-05-09 ENCOUNTER — Telehealth (HOSPITAL_COMMUNITY): Payer: Self-pay | Admitting: Emergency Medicine

## 2021-05-09 ENCOUNTER — Ambulatory Visit (HOSPITAL_COMMUNITY)
Admission: EM | Admit: 2021-05-09 | Discharge: 2021-05-09 | Discharge status: Absent without leave | Payer: Managed Care, Other (non HMO)

## 2021-05-09 NOTE — Telephone Encounter (Signed)
Patient arrived commenting on throat irritation.  No difficulty breathing.  Patient concerned this might lead to bronchitis.  Spoke to Cathlean Marseilles, NP.  Patient is to utilize medications prescribed, may use over the counter medicines :chloraseptic sprays and lozenges.  Increase fluid intake .  And use prescribed medicines as instructed.  Cautioned patient to quarantine, follow instructions, and call pcp.  Instructed if he has breathing difficulty , go to ED.  Patient left department.   ?

## 2021-05-13 ENCOUNTER — Ambulatory Visit: Payer: Managed Care, Other (non HMO) | Admitting: Internal Medicine

## 2021-05-18 ENCOUNTER — Ambulatory Visit (INDEPENDENT_AMBULATORY_CARE_PROVIDER_SITE_OTHER): Payer: Managed Care, Other (non HMO) | Admitting: Internal Medicine

## 2021-05-18 ENCOUNTER — Encounter: Payer: Self-pay | Admitting: Internal Medicine

## 2021-05-18 VITALS — BP 116/60 | HR 89 | Ht 66.0 in | Wt 304.0 lb

## 2021-05-18 DIAGNOSIS — E669 Obesity, unspecified: Secondary | ICD-10-CM | POA: Diagnosis not present

## 2021-05-18 DIAGNOSIS — E559 Vitamin D deficiency, unspecified: Secondary | ICD-10-CM

## 2021-05-18 DIAGNOSIS — R739 Hyperglycemia, unspecified: Secondary | ICD-10-CM

## 2021-05-18 DIAGNOSIS — F5101 Primary insomnia: Secondary | ICD-10-CM

## 2021-05-18 DIAGNOSIS — Z0001 Encounter for general adult medical examination with abnormal findings: Secondary | ICD-10-CM

## 2021-05-18 DIAGNOSIS — F419 Anxiety disorder, unspecified: Secondary | ICD-10-CM

## 2021-05-18 DIAGNOSIS — E538 Deficiency of other specified B group vitamins: Secondary | ICD-10-CM | POA: Diagnosis not present

## 2021-05-18 DIAGNOSIS — J309 Allergic rhinitis, unspecified: Secondary | ICD-10-CM | POA: Diagnosis not present

## 2021-05-18 MED ORDER — CLONAZEPAM 1 MG PO TABS
1.0000 mg | ORAL_TABLET | Freq: Every day | ORAL | 5 refills | Status: DC | PRN
Start: 2021-05-18 — End: 2021-11-16

## 2021-05-18 NOTE — Progress Notes (Signed)
Patient ID: Paul Parsons, male   DOB: 03/21/1995, 26 y.o.   MRN: [id number] ? ? ? ?     Chief Complaint:: wellness exam and obesity,  ? ?     HPI:  Paul Parsons is a 26 y.o. male here for wellness exam; declines HPV vax, hep c screen, o/w up to date ?         ?              Also s/p covid infection 2wks ago, minor uri symtpoms no resolved.  .Does have several wks ongoing nasal allergy symptoms with clearish congestion, itch and sneezing, without fever, pain, ST, cough, swelling or wheezing. Difficult to lose wt with diet and exercise but plans to do better. Would be open to ozempic or mounjaro  Has been sleeping somewhat better recently, has not had to use the ambien recntly.  Pt denies chest pain, increased sob or doe, wheezing, orthopnea, PND, increased LE swelling, palpitations, dizziness or syncope.  Pt denies polydipsia, polyuria, or new focal neuro s/s.    Pt denies fever, wt loss, night sweats, loss of appetite, or other constitutional symptoms   ?  ?Wt Readings from Last 3 Encounters:  ?05/18/21 (!) 304 lb (137.9 kg)  ?04/12/21 (!) 305 lb (138.3 kg)  ?05/12/20 291 lb (132 kg)  ? ?BP Readings from Last 3 Encounters:  ?05/18/21 116/60  ?05/08/21 116/83  ?04/12/21 130/80  ? ?Immunization History  ?Administered Date(s) Administered  ? DTaP 09/03/1996, 11/08/1996, 01/07/1997, 12/29/1997, 07/10/2001  ? HPV Quadrivalent 11/19/2013, 12/20/2013  ? Hepatitis A 09/30/2005, 09/14/2006  ? Hepatitis B 1996/12/07, 08/09/1996, 04/09/1997  ? HiB (PRP-OMP) 09/03/1996, 11/08/1996, 01/07/1997, 10/07/1997  ? IPV 09/03/1996, 11/08/1996, 12/29/1997, 07/10/2001  ? Influenza,inj,Quad PF,6+ Mos 10/29/2017, 11/07/2018, 10/18/2019, 11/11/2020  ? Influenza-Unspecified 10/23/2003, 10/12/2004, 11/09/2005, 11/11/2006, 10/21/2008, 09/07/2009, 10/03/2012, 11/17/2013  ? MMR 10/07/1997, 07/10/2001  ? Meningococcal Conjugate 09/30/2005, 10/03/2012  ? Moderna SARS-COV2 Booster Vaccination 01/01/2020  ? Moderna Sars-Covid-2 Vaccination  03/15/2019, 03/19/2019, 04/15/2019, 04/16/2019  ? PFIZER(Purple Top)SARS-COV-2 Vaccination 11/11/2020  ? Pneumococcal-Unspecified 04/16/1998, 07/03/1998  ? Td 07/25/2007  ? Tdap 07/25/2007, 05/12/2020  ? Varicella 07/15/1997, 09/14/2006  ? ?There are no preventive care reminders to display for this patient. ? ?  ? ?Past Medical History:  ?Diagnosis Date  ? Abdominal pain, recurrent   ? Abdominal pain, recurrent   ? Asthma   ? Chronic constipation   ? Very large hard stools  ? Chronic constipation   ? IBS (irritable bowel syndrome)   ? ?Past Surgical History:  ?Procedure Laterality Date  ? CHOLECYSTECTOMY    ? ERCP  2012  ? MOUTH SURGERY    ?  x 2 , wisdom teeth  ? ? reports that he has never smoked. He has never used smokeless tobacco. He reports that he does not drink alcohol and does not use drugs. ?family history includes Breast cancer in his mother. ?Allergies  ?Allergen Reactions  ? Tetracyclines & Related   ? Zithromax [Azithromycin Dihydrate]   ? ?Current Outpatient Medications on File Prior to Visit  ?Medication Sig Dispense Refill  ? Melatonin 10 MG TABS Take 10 mg by mouth daily as needed.     ? PRESCRIPTION MEDICATION Ibgard 1 2x daily    ? zolpidem (AMBIEN) 10 MG tablet Take 1 tablet (10 mg total) by mouth at bedtime as needed for sleep. 30 tablet 0  ? ?No current facility-administered medications on file prior to visit.  ? ?     ROS:  All others reviewed and negative. ? ?Objective  ? ?     PE:  BP 116/60 (BP Location: Right Arm, Patient Position: Sitting, Cuff Size: Large)   Pulse 89   Ht 5\' 6"  (1.676 m)   Wt (!) 304 lb (137.9 kg)   SpO2 97%   BMI 49.07 kg/m?  ? ?              Constitutional: Pt appears in NAD ?              HENT: Head: NCAT.  ?              Right Ear: External ear normal.   ?              Left Ear: External ear normal.  ?              Eyes: . Pupils are equal, round, and reactive to light. Conjunctivae and EOM are normal ?              Nose: without d/c or deformity ?               Neck: Neck supple. Gross normal ROM ?              Cardiovascular: Normal rate and regular rhythm.   ?              Pulmonary/Chest: Effort normal and breath sounds without rales or wheezing.  ?              Abd:  Soft, NT, ND, + BS, no organomegaly ?              Neurological: Pt is alert. At baseline orientation, motor grossly intact ?              Skin: Skin is warm. No rashes, no other new lesions, LE edema - none ?              Psychiatric: Pt behavior is normal without agitation , mild nervous ? ?Micro: none ? ?Cardiac tracings I have personally interpreted today:  none ? ?Pertinent Radiological findings (summarize): none  ? ?Lab Results  ?Component Value Date  ? WBC 10.6 09/22/2017  ? HGB 15.9 09/22/2017  ? HCT 47.5 09/22/2017  ? PLT 348 09/22/2017  ? GLUCOSE 91 09/22/2017  ? CHOL 153 09/22/2017  ? TRIG 115 09/22/2017  ? HDL 50 09/22/2017  ? LDLCALC 80 09/22/2017  ? ALT 32 09/22/2017  ? AST 21 09/22/2017  ? NA 139 09/22/2017  ? K 4.5 09/22/2017  ? CL 98 09/22/2017  ? CREATININE 1.11 09/22/2017  ? BUN 21 (H) 09/22/2017  ? CO2 24 09/22/2017  ? TSH 1.710 09/22/2017  ? ?Assessment/Plan:  ?Paul Parsons is a 26 y.o. White or Caucasian [1] male with  has a past medical history of Abdominal pain, recurrent, Abdominal pain, recurrent, Asthma, Chronic constipation, Chronic constipation, and IBS (irritable bowel syndrome). ? ?Encounter for well adult exam with abnormal findings ?Age and sex appropriate education and counseling updated with regular exercise and diet ?Referrals for preventative services - declines hep c screen ?Immunizations addressed - decliens HPV vax ?Smoking counseling  - none needed ?Evidence for depression or other mood disorder - chronic anxiety, stable ?Most recent labs reviewed. ?I have personally reviewed and have noted: ?1) the patient's medical and social history ?2) The patient's current medications and supplements ?3) The patient's height, weight, and BMI have been recorded in the  chart ? ? ?Primary insomnia ?Improved recently, ok to follow ? ?Obesity ?D/w pt, will let me know if he wishes to start ozempic or mounjaro if ok with insurance ? ?Allergic rhinitis ?Mild to mod, for otc nasacort asd,  to f/u any worsening symptoms or concerns ? ?Anxiety ?Stable, for klonopin prn refill,  to f/u any worsening symptoms or concerns ? ?Hyperglycemia ?No results found for: HGBA1C ?Stable, pt to continue current medical treatment  - diet, wt loss, with f/u a1c next labs - [facility] ? ?Followup: Return in about 1 year (around 05/19/2022). ? ?Cathlean Cower, MD 05/22/2021 6:55 PM ?[place] [facility] [facility] ?

## 2021-05-18 NOTE — Patient Instructions (Signed)
Please call if your HR dept states your insurance will cover Ozempic (wegovy) or Mounjaro for wt loss ? ?Please continue all other medications as before, and refills have been done if requested - klonopin ? ?Please have the pharmacy call with any other refills you may need. ? ?Please continue your efforts at being more active, low cholesterol diet, and weight control. ? ?You are otherwise up to date with prevention measures today. ? ?Please keep your appointments with your specialists as you may have planned ? ?Please go to the [facility] at the blood drawing area for the tests to be done ? ?Please make an Appointment to return for your 1 year visit, or sooner if needed ?

## 2021-05-22 ENCOUNTER — Encounter: Payer: Self-pay | Admitting: Internal Medicine

## 2021-05-22 NOTE — Assessment & Plan Note (Signed)
Stable, for klonopin prn refill,  to f/u any worsening symptoms or concerns ?

## 2021-05-22 NOTE — Assessment & Plan Note (Signed)
D/w pt, will let me know if he wishes to start ozempic or mounjaro if ok with insurance ?

## 2021-05-22 NOTE — Assessment & Plan Note (Signed)
Improved recently, ok to follow ?

## 2021-05-22 NOTE — Assessment & Plan Note (Signed)
Age and sex appropriate education and counseling updated with regular exercise and diet ?Referrals for preventative services - declines hep c screen ?Immunizations addressed - decliens HPV vax ?Smoking counseling  - none needed ?Evidence for depression or other mood disorder - chronic anxiety, stable ?Most recent labs reviewed. ?I have personally reviewed and have noted: ?1) the patient's medical and social history ?2) The patient's current medications and supplements ?3) The patient's height, weight, and BMI have been recorded in the chart ? ?

## 2021-05-22 NOTE — Assessment & Plan Note (Signed)
Mild to mod, for otc nasacort asd, to f/u any worsening symptoms or concerns ?

## 2021-05-22 NOTE — Assessment & Plan Note (Addendum)
No results found for: HGBA1C ?Stable, pt to continue current medical treatment  - diet, wt loss, with f/u a1c next labs - [facility] ? ?

## 2021-05-31 ENCOUNTER — Telehealth: Payer: Self-pay | Admitting: Pulmonary Disease

## 2021-06-01 ENCOUNTER — Other Ambulatory Visit: Payer: Self-pay | Admitting: Pulmonary Disease

## 2021-06-01 ENCOUNTER — Telehealth: Payer: Self-pay | Admitting: Adult Health

## 2021-06-01 NOTE — Telephone Encounter (Signed)
Dr. O, please advise. 

## 2021-06-01 NOTE — Telephone Encounter (Signed)
Dr. O, please advise on refill request. ?

## 2021-06-02 MED ORDER — ZOLPIDEM TARTRATE 10 MG PO TABS: 10.0000 mg | ORAL_TABLET | Freq: Every evening | ORAL | 3 refills | Status: DC | PRN

## 2021-06-02 NOTE — Telephone Encounter (Signed)
Attempted to call pt to let him know that the Rx was sent but unable to reach. Left a detailed  message for pt letting him know this had been done. Nothing further needed. ?

## 2021-06-02 NOTE — Telephone Encounter (Signed)
Orders placed with refills ?

## 2021-06-02 NOTE — Telephone Encounter (Signed)
Paul Parsons  Patient Medication Renewal Request Pool 2 days ago  ? ?Refills have been requested for the following medications: ?  ?    zolpidem (AMBIEN) 10 MG tablet [Adewale A Olalere] ?  ?[facility]: [id number] - [place], [place] - [address] AT [address] OF [facility] [address] ?Delivery method: Pickup ? ? ? ? ?Message has been sent by pt about med needing to have refilled. Pt has also called the office multiple times and has also sent multiple mychart messages about this too. Dr. Val Eagle, please advise. ?

## 2021-06-02 NOTE — Telephone Encounter (Signed)
Separate encounter has been sent to Dr. Jenetta Downer about pt needing med refilled. Closing this encounter. ?

## 2021-09-22 ENCOUNTER — Other Ambulatory Visit: Payer: Self-pay | Admitting: Pulmonary Disease

## 2021-09-23 ENCOUNTER — Other Ambulatory Visit: Payer: Self-pay | Admitting: Pulmonary Disease

## 2021-09-23 MED ORDER — ZOLPIDEM TARTRATE 10 MG PO TABS: 10.0000 mg | ORAL_TABLET | Freq: Every evening | ORAL | 0 refills | Status: DC | PRN

## 2021-09-23 NOTE — Telephone Encounter (Signed)
Okay will refill , PMP reviewed

## 2021-09-23 NOTE — Telephone Encounter (Signed)
Mychart message sent by pt: [facility] (supporting Julio Sicks, NP) 18 hours ago (10:33 PM)    Hello! I am sending an early message because I am out of refills for my prescription of Ambien 10mg  and [facility] [address] Thank you so much for your help!     Tammy, please advise.

## 2021-10-20 ENCOUNTER — Other Ambulatory Visit: Payer: Self-pay | Admitting: Adult Health

## 2021-10-21 MED ORDER — ZOLPIDEM TARTRATE 10 MG PO TABS: 10.0000 mg | ORAL_TABLET | Freq: Every evening | ORAL | 2 refills | Status: DC | PRN

## 2021-10-21 NOTE — Telephone Encounter (Signed)
Tammy, please advise on pt's Ambien 10mg  refill.  Last filled on 09/23/21 for #30 with 0 refills. Last seen on 04/12/21 for Insomnia and advised to follow up in a year.

## 2021-10-21 NOTE — Telephone Encounter (Signed)
No problem will send right now  Keep follow up as planned and As needed

## 2021-11-15 ENCOUNTER — Encounter: Payer: Self-pay | Admitting: Internal Medicine

## 2021-11-16 MED ORDER — CLONAZEPAM 1 MG PO TABS: 1.0000 mg | ORAL_TABLET | Freq: Every day | ORAL | 5 refills | Status: DC | PRN

## 2022-01-06 ENCOUNTER — Ambulatory Visit: Payer: Managed Care, Other (non HMO) | Admitting: Internal Medicine

## 2022-01-06 ENCOUNTER — Encounter: Payer: Self-pay | Admitting: Internal Medicine

## 2022-01-06 VITALS — BP 128/76 | HR 84 | Temp 97.6°F | Ht 66.0 in | Wt 333.0 lb

## 2022-01-06 DIAGNOSIS — E669 Obesity, unspecified: Secondary | ICD-10-CM | POA: Diagnosis not present

## 2022-01-06 DIAGNOSIS — F419 Anxiety disorder, unspecified: Secondary | ICD-10-CM | POA: Diagnosis not present

## 2022-01-06 DIAGNOSIS — J309 Allergic rhinitis, unspecified: Secondary | ICD-10-CM | POA: Diagnosis not present

## 2022-01-06 DIAGNOSIS — R739 Hyperglycemia, unspecified: Secondary | ICD-10-CM | POA: Diagnosis not present

## 2022-01-06 NOTE — Assessment & Plan Note (Signed)
Mild to mod, for otc allegra or nasacort asd,  to f/u any worsening symptoms or concerns

## 2022-01-06 NOTE — Patient Instructions (Signed)
Please continue all other medications as before, and refills have been done if requested.  Please have the pharmacy call with any other refills you may need.  Please continue your efforts at being more active, low cholesterol diet, and weight control.  You are otherwise up to date with prevention measures today.  Please keep your appointments with your specialists as you may have planned  Please have your lab work done at [facility] as you mentioned  You will be contacted by phone if any changes need to be made immediately.  Otherwise, you will receive a letter about your results with an explanation, but please check with MyChart first.  Please remember to sign up for MyChart if you have not done so, as this will be important to you in the future with finding out test results, communicating by private email, and scheduling acute appointments online when needed.  Please make an Appointment to return in 6 months, or sooner if needed

## 2022-01-06 NOTE — Assessment & Plan Note (Signed)
Stable overall, to continue klonopin prn

## 2022-01-06 NOTE — Assessment & Plan Note (Signed)
D/w pt, declines consider wegovy or zepbound for now, to work on diet, exercise,  to f/u any worsening symptoms or concerns

## 2022-01-06 NOTE — Progress Notes (Signed)
Patient ID: Paul Parsons, male   DOB: 1995-02-19, 26 y.o.   MRN: [id number]        Chief Complaint: follow up obesity, hyperglycemia, anxiety, obesity       HPI:  Paul Parsons is a 26 y.o. male here overall doing ok, Pt denies chest pain, increased sob or doe, wheezing, orthopnea, PND, increased LE swelling, palpitations, dizziness or syncope.   Pt denies polydipsia, polyuria, or new focal neuro s/s.    Pt denies fever, wt loss, night sweats, loss of appetite, or other constitutional symptoms  hard to lose wt with diet and exercise, in fact gained near 20 lbs in last 6 mo.  Does c/o ongoing fatigue, but denies signficant daytime hypersomnolence.  Does have several wks ongoing nasal allergy symptoms with clearish congestion, itch and sneezing, without fever, pain, ST, cough, swelling or wheezing.   Pt denies polydipsia, polyuria, or new focal neuro s/s.   Pt asking for lab orders for [facility] today.  Denies worsening depressive symptoms, suicidal ideation, or panic; has ongoing anxiety       Wt Readings from Last 3 Encounters:  01/06/22 (!) 333 lb (151 kg)  05/18/21 (!) 304 lb (137.9 kg)  04/12/21 (!) 305 lb (138.3 kg)   BP Readings from Last 3 Encounters:  01/06/22 128/76  05/18/21 116/60  05/08/21 116/83         Past Medical History:  Diagnosis Date   Abdominal pain, recurrent    Abdominal pain, recurrent    Asthma    Chronic constipation    Very large hard stools   Chronic constipation    IBS (irritable bowel syndrome)    Past Surgical History:  Procedure Laterality Date   CHOLECYSTECTOMY     ERCP  2012   MOUTH SURGERY      x 2 , wisdom teeth    reports that he has never smoked. He has never used smokeless tobacco. He reports that he does not drink alcohol and does not use drugs. family history includes Breast cancer in his mother. Allergies  Allergen Reactions   Tetracyclines & Related    Zithromax [Azithromycin Dihydrate]    Current Outpatient Medications on File Prior  to Visit  Medication Sig Dispense Refill   clonazePAM (KLONOPIN) 1 MG tablet Take 1 tablet (1 mg total) by mouth daily as needed for anxiety. 30 tablet 5   Melatonin 10 MG TABS Take 10 mg by mouth daily as needed.      PRESCRIPTION MEDICATION Ibgard 1 2x daily     zolpidem (AMBIEN) 10 MG tablet Take 1 tablet (10 mg total) by mouth at bedtime as needed for sleep. 30 tablet 2   No current facility-administered medications on file prior to visit.        ROS:  All others reviewed and negative.  Objective        PE:  BP 128/76 (BP Location: Right Arm, Patient Position: Sitting, Cuff Size: Large)   Pulse 84   Temp 97.6 F (36.4 C) (Oral)   Ht 5\' 6"  (1.676 m)   Wt (!) 333 lb (151 kg)   SpO2 96%   BMI 53.75 kg/m                 Constitutional: Pt appears in NAD, morbid obese               HENT: Head: NCAT.                Right Ear: External  ear normal.                 Left Ear: External ear normal.                Eyes: . Pupils are equal, round, and reactive to light. Conjunctivae and EOM are normal               Nose: without d/c or deformity               Neck: Neck supple. Gross normal ROM               Cardiovascular: Normal rate and regular rhythm.                 Pulmonary/Chest: Effort normal and breath sounds without rales or wheezing.                Abd:  Soft, NT, ND, + BS, no organomegaly               Neurological: Pt is alert. At baseline orientation, motor grossly intact               Skin: Skin is warm. No rashes, no other new lesions, LE edema - none               Psychiatric: Pt behavior is normal without agitation   Micro: none  Cardiac tracings I have personally interpreted today:  none  Pertinent Radiological findings (summarize): none   Lab Results  Component Value Date   WBC 10.6 09/22/2017   HGB 15.9 09/22/2017   HCT 47.5 09/22/2017   PLT 348 09/22/2017   GLUCOSE 91 09/22/2017   CHOL 153 09/22/2017   TRIG 115 09/22/2017   HDL 50 09/22/2017   LDLCALC  80 09/22/2017   ALT 32 09/22/2017   AST 21 09/22/2017   NA 139 09/22/2017   K 4.5 09/22/2017   CL 98 09/22/2017   CREATININE 1.11 09/22/2017   BUN 21 (H) 09/22/2017   CO2 24 09/22/2017   TSH 1.710 09/22/2017   Paul Parsons is a 26 y.o. White or Caucasian [1] male with  has a past medical history of Abdominal pain, recurrent, Abdominal pain, recurrent, Asthma, Chronic constipation, Chronic constipation, and IBS (irritable bowel syndrome).  Allergic rhinitis Mild to mod, for otc allegra or nasacort asd,  to f/u any worsening symptoms or concerns   Anxiety Stable overall, to continue klonopin prn  Hyperglycemia No results found for: "HGBA1C" Stable, pt to continue current medical treatment  - diet, wt control, exercise - for A1c with labs   Obesity D/w pt, declines consider wegovy or zepbound for now, to work on diet, exercise,  to f/u any worsening symptoms or concerns  Followup: Return in about 6 months (around 07/08/2022).  Paul Barre, MD 01/06/2022 12:48 PM [facility]

## 2022-01-06 NOTE — Assessment & Plan Note (Signed)
No results found for: "HGBA1C" Stable, pt to continue current medical treatment  - diet, wt control, exercise - for A1c with labs

## 2022-01-13 ENCOUNTER — Telehealth: Payer: Self-pay | Admitting: Adult Health

## 2022-01-13 ENCOUNTER — Other Ambulatory Visit: Payer: Self-pay | Admitting: Pulmonary Disease

## 2022-01-13 MED ORDER — ZOLPIDEM TARTRATE 10 MG PO TABS: 10.0000 mg | ORAL_TABLET | Freq: Every evening | ORAL | 2 refills | Status: DC | PRN

## 2022-01-13 NOTE — Telephone Encounter (Signed)
Called patient and he states that he is needing refill for his Ambien. Patient normally sees Tammy but she is out of office.   Katie please advise on this refill

## 2022-01-13 NOTE — Telephone Encounter (Signed)
Mychart message sent by pt: Paul Parsons [facility] Pool (supporting Tammy S Parrett, NP)Just now (12:02 PM)    Hello! I just called and left a message because I noticed Tammy is out of office till the 3rd, but I need a [facility] [address] [facility]. Thank you so much for all of your help!     Dr. Val Eagle, please advise.

## 2022-01-13 NOTE — Telephone Encounter (Signed)
Patient called to request a refill for Ambien.  [facility] [address]  CB# [phone]

## 2022-01-13 NOTE — Telephone Encounter (Signed)
Good afternoon sir,  Can you refill this patients Ambien. He is a patient of yours and is needing his refill please and thank you   Please advises ir

## 2022-01-13 NOTE — Telephone Encounter (Signed)
Refill sent in

## 2022-01-14 ENCOUNTER — Other Ambulatory Visit: Payer: Self-pay | Admitting: Internal Medicine

## 2022-01-18 LAB — COMPREHENSIVE METABOLIC PANEL
ALT: 46 IU/L — ABNORMAL HIGH (ref 0–44)
AST: 22 IU/L (ref 0–40)
Albumin/Globulin Ratio: 1.8 (ref 1.2–2.2)
Albumin: 4.2 g/dL — ABNORMAL LOW (ref 4.3–5.2)
Alkaline Phosphatase: 147 IU/L — ABNORMAL HIGH (ref 44–121)
BUN/Creatinine Ratio: 11 (ref 9–20)
BUN: 15 mg/dL (ref 6–20)
Bilirubin Total: 0.4 mg/dL (ref 0.0–1.2)
CO2: 20 mmol/L (ref 20–29)
Calcium: 9.3 mg/dL (ref 8.7–10.2)
Chloride: 101 mmol/L (ref 96–106)
Creatinine, Ser: 1.37 mg/dL — ABNORMAL HIGH (ref 0.76–1.27)
Globulin, Total: 2.3 g/dL (ref 1.5–4.5)
Glucose: 91 mg/dL (ref 70–99)
Potassium: 4.2 mmol/L (ref 3.5–5.2)
Sodium: 140 mmol/L (ref 134–144)
Total Protein: 6.5 g/dL (ref 6.0–8.5)
eGFR: 73 mL/min/{1.73_m2} (ref >59)

## 2022-01-18 LAB — CBC WITH DIFFERENTIAL/PLATELET
Basophils Absolute: 0 10*3/uL (ref 0.0–0.2)
Basos: 0 %
EOS (ABSOLUTE): 0.3 10*3/uL (ref 0.0–0.4)
Eos: 3 %
Hematocrit: 44.5 % (ref 37.5–51.0)
Hemoglobin: 14.2 g/dL (ref 13.0–17.7)
Immature Grans (Abs): 0 10*3/uL (ref 0.0–0.1)
Immature Granulocytes: 0 %
Lymphocytes Absolute: 4.3 10*3/uL — ABNORMAL HIGH (ref 0.7–3.1)
Lymphs: 44 %
MCH: 27 pg (ref 26.6–33.0)
MCHC: 31.9 g/dL (ref 31.5–35.7)
MCV: 85 fL (ref 79–97)
Monocytes Absolute: 0.6 10*3/uL (ref 0.1–0.9)
Monocytes: 6 %
Neutrophils Absolute: 4.5 10*3/uL (ref 1.4–7.0)
Neutrophils: 47 %
Platelets: 339 10*3/uL (ref 150–450)
RBC: 5.26 x10E6/uL (ref 4.14–5.80)
RDW: 13.4 % (ref 11.6–15.4)
WBC: 9.7 10*3/uL (ref 3.4–10.8)

## 2022-01-18 LAB — LIPID PANEL W/O CHOL/HDL RATIO
Cholesterol, Total: 149 mg/dL (ref 100–199)
HDL: 44 mg/dL (ref >39)
LDL Chol Calc (NIH): 80 mg/dL (ref 0–99)
Triglycerides: 144 mg/dL (ref 0–149)
VLDL Cholesterol Cal: 25 mg/dL (ref 5–40)

## 2022-01-18 LAB — SPECIMEN STATUS REPORT

## 2022-01-18 LAB — TSH: TSH: 3.45 u[IU]/mL (ref 0.450–4.500)

## 2022-01-18 LAB — VITAMIN B12: Vitamin B-12: 638 pg/mL (ref 232–1245)

## 2022-01-18 LAB — HGB A1C W/O EAG: Hgb A1c MFr Bld: 5.8 % — ABNORMAL HIGH (ref 4.8–5.6)

## 2022-01-18 LAB — VITAMIN D 25 HYDROXY (VIT D DEFICIENCY, FRACTURES): Vit D, 25-Hydroxy: 10.9 ng/mL — ABNORMAL LOW (ref 30.0–100.0)

## 2022-02-22 ENCOUNTER — Encounter: Payer: Self-pay | Admitting: Family Medicine

## 2022-02-22 ENCOUNTER — Ambulatory Visit (INDEPENDENT_AMBULATORY_CARE_PROVIDER_SITE_OTHER): Payer: Managed Care, Other (non HMO) | Admitting: Family Medicine

## 2022-02-22 VITALS — BP 116/66 | HR 85 | Temp 98.0°F | Ht 66.0 in | Wt 329.7 lb

## 2022-02-22 DIAGNOSIS — J019 Acute sinusitis, unspecified: Secondary | ICD-10-CM | POA: Diagnosis not present

## 2022-02-22 MED ORDER — AMOXICILLIN-POT CLAVULANATE 875-125 MG PO TABS: 1.0000 | ORAL_TABLET | Freq: Two times a day (BID) | ORAL | 0 refills | Status: DC

## 2022-02-22 NOTE — Progress Notes (Signed)
Established Patient Office Visit  Subjective   Patient ID: Paul Parsons, male    DOB: 09/08/1995  Age: 27 y.o. MRN: [id number]  Chief Complaint  Patient presents with   Facial Pain    Patient complains of facial pain. X5 days, Tried Zyrtec with little relief   Cough    Patient complains of cough, x5 days, Productive with yellow sputum     HPI   Torres is seen as a work and with onset over a week ago of cold-like symptoms.  He actually went to urgent care last Friday and was told to take Zyrtec.  His symptoms seemed to be getting a little better over the weekend and then seem to worsen this week.  He had increased aches and facial pressure.  Now has some thick yellowish colored nasal discharge.  Minimal cough.  Increasing headaches.  No fever.  He reports history of asthma.  Not aware of any wheezing or shortness of breath.  He has been using over-the-counter Mucinex without much improvement.  Past Medical History:  Diagnosis Date   Abdominal pain, recurrent    Abdominal pain, recurrent    Asthma    Chronic constipation    Very large hard stools   Chronic constipation    IBS (irritable bowel syndrome)    Past Surgical History:  Procedure Laterality Date   CHOLECYSTECTOMY     ERCP  2012   MOUTH SURGERY      x 2 , wisdom teeth    reports that he has never smoked. He has never used smokeless tobacco. He reports that he does not drink alcohol and does not use drugs. family history includes Breast cancer in his mother. Allergies  Allergen Reactions   Tetracyclines & Related    Zithromax [Azithromycin Dihydrate]     Review of Systems  Constitutional:  Negative for chills and fever.  HENT:  Positive for congestion and sinus pain. Negative for nosebleeds.   Respiratory:  Positive for cough.   Cardiovascular:  Negative for chest pain.  Neurological:  Positive for headaches.      Objective:     BP 116/66 (BP Location: Left Arm, Patient Position: Sitting, Cuff Size:  Large)   Pulse 85   Temp 98 F (36.7 C) (Oral)   Ht 5\' 6"  (1.676 m)   Wt (!) 329 lb 11.2 oz (149.6 kg)   SpO2 98%   BMI 53.21 kg/m    Physical Exam Vitals reviewed.  HENT:     Right Ear: Tympanic membrane normal.     Left Ear: Tympanic membrane normal.     Mouth/Throat:     Mouth: Mucous membranes are moist.     Pharynx: Oropharynx is clear. No oropharyngeal exudate or posterior oropharyngeal erythema.  Cardiovascular:     Rate and Rhythm: Normal rate and regular rhythm.  Pulmonary:     Effort: Pulmonary effort is normal.     Breath sounds: Normal breath sounds. No wheezing or rales.  Musculoskeletal:     Cervical back: Neck supple.  Lymphadenopathy:     Cervical: No cervical adenopathy.  Neurological:     Mental Status: He is alert.      No results found for any visits on 02/22/22.    The ASCVD Risk score (Arnett DK, et al., 2019) failed to calculate for the following reasons:   The 2019 ASCVD risk score is only valid for ages 4 to 78    Assessment & Plan:   Acute sinusitis.  Suspect  this started viral.  He has now had worsening of symptoms after some initial improvement with increased headaches, facial pain, and purulent nasal discharge.  -Start Augmentin 875 mg twice daily for 10 days.  Take with food.  Continue over-the-counter plain Mucinex. -Follow-up for any persistent or worsening symptoms   Carolann Littler, MD

## 2022-02-23 ENCOUNTER — Ambulatory Visit: Payer: Managed Care, Other (non HMO) | Admitting: Internal Medicine

## 2022-02-23 NOTE — Telephone Encounter (Signed)
RX was sent into pharmacy on 12/28.

## 2022-02-28 NOTE — Telephone Encounter (Signed)
Mychart message sent by pt: Paul Parsons [facility] Pool (supporting Tammy S Parrett, NP)3 days ago    Hello! I am not able to write a message to Dr. Ander Slade so I am using this but I recently got a new insurance, [employer], and [facility] that it only covers 15 tablets for 30 days, when I have been prescribed 30 tablets for 30 days for awhile now.    I was told to message my doctor to get this fixed with a possible pre authorization to allow the 30 tablets for 30 days I am usually prescribed.   Please let me know if there is anything possible to handle this. Thank you so much!    Routing to prior auth team for review to see if they can help get this fixed for pt.

## 2022-03-01 ENCOUNTER — Telehealth: Payer: Self-pay | Admitting: Pulmonary Disease

## 2022-03-01 ENCOUNTER — Encounter: Payer: Self-pay | Admitting: Internal Medicine

## 2022-03-01 NOTE — Telephone Encounter (Signed)
pt called because when he last got his refill for Ambien he was given only as 15 day supply instead of 30 believes its due to a change in his insurance . pt is currently out of medications so he needs new Rx. he works @ [employer] so he may not be able to answer call but if we could send message via [place]  - [phone]

## 2022-03-01 NOTE — Telephone Encounter (Signed)
Dr Ander Slade, please refill Lorrin Mais and let us know once this was sent and I will send him msg via mychart, thanks!

## 2022-03-02 ENCOUNTER — Other Ambulatory Visit: Payer: Self-pay | Admitting: Pulmonary Disease

## 2022-03-02 MED ORDER — ZOLPIDEM TARTRATE 10 MG PO TABS: 10.0000 mg | ORAL_TABLET | Freq: Every evening | ORAL | 2 refills | Status: DC | PRN

## 2022-03-02 NOTE — Telephone Encounter (Signed)
Sent my chart message to patient in regards to Ambien prescription. Nothing further needed.

## 2022-03-18 ENCOUNTER — Encounter: Payer: Self-pay | Admitting: Family Medicine

## 2022-03-18 ENCOUNTER — Ambulatory Visit (INDEPENDENT_AMBULATORY_CARE_PROVIDER_SITE_OTHER): Payer: 59 | Admitting: Family Medicine

## 2022-03-18 VITALS — BP 116/80 | HR 105 | Temp 98.7°F | Ht 66.0 in | Wt 334.0 lb

## 2022-03-18 DIAGNOSIS — H109 Unspecified conjunctivitis: Secondary | ICD-10-CM

## 2022-03-18 DIAGNOSIS — B9689 Other specified bacterial agents as the cause of diseases classified elsewhere: Secondary | ICD-10-CM | POA: Diagnosis not present

## 2022-03-18 MED ORDER — POLYMYXIN B-TRIMETHOPRIM 10000-0.1 UNIT/ML-% OP SOLN: 2.0000 [drp] | OPHTHALMIC | 0 refills | Status: AC

## 2022-03-18 NOTE — Progress Notes (Signed)
   Established Patient Office Visit  Subjective   Patient ID: Paul Parsons, male    DOB: [birth year]  Age: 27 y.o. MRN: [id number]  Chief Complaint  Patient presents with   Conjunctivitis    Patient complains of conjunctivitis, x1 day    HPI   [name] is seen today as a work in with bilateral eye symptoms.  He states he woke up yesterday with some drainage from the left eye and then subsequently involvement of the right eye.  He has some redness and states that this morning his eyes were glued shut.  This improved after warm shower.  No blurred vision.  He does use contacts regularly but left them out after signs of infection.  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  No other respiratory symptoms.  Allergy to tetracycline and Zithromax.  Past Medical History:  Diagnosis Date   Abdominal pain, recurrent    Abdominal pain, recurrent    Asthma    Chronic constipation    Very large hard stools   Chronic constipation    IBS (irritable bowel syndrome)    Past Surgical History:  Procedure Laterality Date   CHOLECYSTECTOMY     ERCP  2012   MOUTH SURGERY      x 2 , wisdom teeth    reports that he has never smoked. He has never used smokeless tobacco. He reports that he does not drink alcohol and does not use drugs. family history includes Breast cancer in his mother. Allergies  Allergen Reactions   Tetracyclines & Related    Zithromax [Azithromycin Dihydrate]     Review of Systems  Constitutional:  Negative for chills and fever.  Eyes:  Positive for discharge and redness. Negative for blurred vision and pain.      Objective:     BP 116/80 (BP Location: Left Arm, Patient Position: Sitting, Cuff Size: Large)   Pulse (!) 105   Temp 98.7 F (37.1 C) (Oral)   Ht '5\' 6"'$  (1.676 m)   Wt (!) 334 lb (151.5 kg)   SpO2 97%   BMI 53.91 kg/m    Physical Exam Vitals reviewed.  Constitutional:      Appearance: Normal appearance.  Eyes:      Comments: Both conjunctive are very erythematous.  He has a little bit of yellowish discharge bilaterally.  Pupils equal round reactive to light.  Cornea appears normal.  Cardiovascular:     Rate and Rhythm: Normal rate and regular rhythm.  Neurological:     Mental Status: He is alert.      No results found for any visits on 03/18/22.    The ASCVD Risk score (Arnett DK, et al., 2019) failed to calculate for the following reasons:   The 2019 ASCVD risk score is only valid for ages 17 to 80    Assessment & Plan:   Problem List Items Addressed This Visit   None Visit Diagnoses     Bacterial conjunctivitis of both eyes    -  Primary     -Continue warm compresses several times daily -Polytrim ophthalmic drops-2 drops each eye every 4 hours while awake  No follow-ups on file.    Carolann Littler, MD

## 2022-03-25 ENCOUNTER — Ambulatory Visit (INDEPENDENT_AMBULATORY_CARE_PROVIDER_SITE_OTHER): Payer: 59 | Admitting: Internal Medicine

## 2022-03-25 VITALS — BP 136/82 | HR 87 | Temp 98.0°F | Ht 66.0 in | Wt 332.0 lb

## 2022-03-25 DIAGNOSIS — J029 Acute pharyngitis, unspecified: Secondary | ICD-10-CM | POA: Diagnosis not present

## 2022-03-25 DIAGNOSIS — J069 Acute upper respiratory infection, unspecified: Secondary | ICD-10-CM | POA: Diagnosis not present

## 2022-03-25 DIAGNOSIS — R739 Hyperglycemia, unspecified: Secondary | ICD-10-CM | POA: Diagnosis not present

## 2022-03-25 DIAGNOSIS — E559 Vitamin D deficiency, unspecified: Secondary | ICD-10-CM | POA: Diagnosis not present

## 2022-03-25 LAB — POC INFLUENZA A&B (BINAX/QUICKVUE)
Influenza A, POC: NEGATIVE
Influenza B, POC: NEGATIVE

## 2022-03-25 LAB — POC SOFIA SARS ANTIGEN FIA: SARS Coronavirus 2 Ag: NEGATIVE

## 2022-03-25 LAB — POCT RAPID STREP A (OFFICE): Rapid Strep A Screen: NEGATIVE

## 2022-03-25 LAB — POCT RESPIRATORY SYNCYTIAL VIRUS: RSV Rapid Ag: NEGATIVE

## 2022-03-25 MED ORDER — HYDROCODONE BIT-HOMATROP MBR 5-1.5 MG/5ML PO SOLN: 5.0000 mL | Freq: Four times a day (QID) | ORAL | 0 refills | Status: AC | PRN

## 2022-03-25 MED ORDER — DOXYCYCLINE HYCLATE 100 MG PO TABS: 100.0000 mg | ORAL_TABLET | Freq: Two times a day (BID) | ORAL | 0 refills | Status: DC

## 2022-03-25 NOTE — Patient Instructions (Signed)
Your COVID, Flu, and RSV testing is negative  Please take all new medication as prescribed - the antibiotic, and cough medicine if needed  Please continue all other medications as before, and refills have been done if requested.  Please have the pharmacy call with any other refills you may need.  Please keep your appointments with your specialists as you may have planned

## 2022-03-25 NOTE — Progress Notes (Unsigned)
Patient ID: Paul Parsons, male   DOB: Apr 28, 1995, 27 y.o.   MRN: [id number]        Chief Complaint: follow up severe ST low vit d, hyeprglycemia       HPI:  Paul Parsons is a 27 y.o. male here with c/o  Here with 2-3 days acute onset fever, facial pain, pressure, headache, general weakness and malaise, and greenish d/c, with mild ST and cough, but pt denies chest pain, wheezing, increased sob or doe, orthopnea, PND, increased LE swelling, palpitations, dizziness or syncope.   Pt denies polydipsia, polyuria, or new focal neuro s/s.    Pt denies fever, wt loss, night sweats, loss of appetite, or other constitutional symptoms         Wt Readings from Last 3 Encounters:  03/25/22 (!) 332 lb (150.6 kg)  03/18/22 (!) 334 lb (151.5 kg)  02/22/22 (!) 329 lb 11.2 oz (149.6 kg)   BP Readings from Last 3 Encounters:  03/25/22 136/82  03/18/22 116/80  02/22/22 116/66         Past Medical History:  Diagnosis Date   Abdominal pain, recurrent    Abdominal pain, recurrent    Asthma    Chronic constipation    Very large hard stools   Chronic constipation    IBS (irritable bowel syndrome)    Past Surgical History:  Procedure Laterality Date   CHOLECYSTECTOMY     ERCP  2012   MOUTH SURGERY      x 2 , wisdom teeth    reports that he has never smoked. He has never used smokeless tobacco. He reports that he does not drink alcohol and does not use drugs. family history includes Breast cancer in his mother. Allergies  Allergen Reactions   Tetracyclines & Related    Zithromax [Azithromycin Dihydrate]    Current Outpatient Medications on File Prior to Visit  Medication Sig Dispense Refill   albuterol (VENTOLIN HFA) 108 (90 Base) MCG/ACT inhaler Inhale into the lungs.     clonazePAM (KLONOPIN) 1 MG tablet Take 1 tablet (1 mg total) by mouth daily as needed for anxiety. 30 tablet 5   Melatonin 10 MG TABS Take 10 mg by mouth daily as needed.      PRESCRIPTION MEDICATION Ibgard 1 2x daily      trimethoprim-polymyxin b (POLYTRIM) ophthalmic solution Place 2 drops into both eyes every 4 (four) hours. 10 mL 0   zolpidem (AMBIEN) 10 MG tablet Take 1 tablet (10 mg total) by mouth at bedtime as needed for sleep. 30 tablet 2   No current facility-administered medications on file prior to visit.        ROS:  All others reviewed and negative.  Objective        PE:  BP 136/82   Pulse 87   Temp 98 F (36.7 C) (Oral)   Ht '5\' 6"'$  (1.676 m)   Wt (!) 332 lb (150.6 kg)   SpO2 97%   BMI 53.59 kg/m                 Constitutional: Pt appears mild ill               HENT: Head: NCAT.                Right Ear: External ear normal.                 Left Ear: External ear normal. Bilat tm's with mild erythema.  Max sinus  areas mild tender.  Pharynx with mild erythema, no exudate               Eyes: . Pupils are equal, round, and reactive to light. Conjunctivae and EOM are normal               Nose: without d/c or deformity               Neck: Neck supple. Gross normal ROM               Cardiovascular: Normal rate and regular rhythm.                 Pulmonary/Chest: Effort normal and breath sounds without rales or wheezing.                               Neurological: Pt is alert. At baseline orientation, motor grossly intact               Skin: Skin is warm. No rashes, no other new lesions, LE edema - none               Psychiatric: Pt behavior is normal without agitation   Micro: none  Cardiac tracings I have personally interpreted today:  none  Pertinent Radiological findings (summarize): none   Lab Results  Component Value Date   WBC 9.7 01/14/2022   HGB 14.2 01/14/2022   HCT 44.5 01/14/2022   PLT 339 01/14/2022   GLUCOSE 91 01/14/2022   CHOL 149 01/14/2022   TRIG 144 01/14/2022   HDL 44 01/14/2022   LDLCALC 80 01/14/2022   ALT 46 (H) 01/14/2022   AST 22 01/14/2022   NA 140 01/14/2022   K 4.2 01/14/2022   CL 101 01/14/2022   CREATININE 1.37 (H) 01/14/2022   BUN 15  01/14/2022   CO2 20 01/14/2022   TSH 3.450 01/14/2022   HGBA1C 5.8 (H) 01/14/2022   POCT - COVID - neg, Flu - neg, RSV - neg, Strep - neg  Assessment/Plan:  Paul Parsons is a 27 y.o. White or Caucasian [1] male with  has a past medical history of Abdominal pain, recurrent, Abdominal pain, recurrent, Asthma, Chronic constipation, Chronic constipation, and IBS (irritable bowel syndrome).  Acute upper respiratory infection Mild to mod, POCt testing negative, for doxycycline 100 bid course,  to f/u any worsening symptoms or concerns   Hyperglycemia Lab Results  Component Value Date   HGBA1C 5.8 (H) 01/14/2022   Stable, pt to continue current medical treatment  - diet, wt control  Vitamin D deficiency Last vitamin D Lab Results  Component Value Date   VD25OH 10.9 (L) 01/14/2022   Low, to start oral replacement  Followup: Return if symptoms worsen or fail to improve.  Cathlean Cower, MD 03/26/2022 9:45 PM [facility]

## 2022-03-26 ENCOUNTER — Encounter: Payer: Self-pay | Admitting: Internal Medicine

## 2022-03-26 DIAGNOSIS — J069 Acute upper respiratory infection, unspecified: Secondary | ICD-10-CM | POA: Insufficient documentation

## 2022-03-26 DIAGNOSIS — E559 Vitamin D deficiency, unspecified: Secondary | ICD-10-CM | POA: Insufficient documentation

## 2022-03-26 NOTE — Assessment & Plan Note (Signed)
Lab Results  Component Value Date   HGBA1C 5.8 (H) 01/14/2022   Stable, pt to continue current medical treatment  - diet, wt control

## 2022-03-26 NOTE — Assessment & Plan Note (Signed)
Last vitamin D Lab Results  Component Value Date   VD25OH 10.9 (L) 01/14/2022   Low, to start oral replacement

## 2022-03-26 NOTE — Assessment & Plan Note (Signed)
Mild to mod, POCt testing negative, for doxycycline 100 bid course,  to f/u any worsening symptoms or concerns

## 2022-03-30 ENCOUNTER — Ambulatory Visit (INDEPENDENT_AMBULATORY_CARE_PROVIDER_SITE_OTHER): Payer: 59 | Admitting: Adult Health

## 2022-03-30 ENCOUNTER — Encounter: Payer: Self-pay | Admitting: *Deleted

## 2022-03-30 ENCOUNTER — Encounter: Payer: Self-pay | Admitting: Adult Health

## 2022-03-30 VITALS — BP 120/60 | HR 100 | Temp 97.7°F | Ht 65.0 in | Wt 334.8 lb

## 2022-03-30 DIAGNOSIS — F5101 Primary insomnia: Secondary | ICD-10-CM

## 2022-03-30 NOTE — Progress Notes (Signed)
$'@Patient'x$  ID: Paul Parsons, male    DOB: 05-04-1995, 27 y.o.   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  Patient is followed for chronic insomnia.  Has tried multiple medications over the years for insomnia.  Underwent home sleep study was negative for sleep apnea.  He has trouble going to sleep and staying asleep throughout the night.  He has been on Ambien over the last couple years which helps him sleep the most.  Patient is here for refills. He also has some intermittent anxiety.  And takes Klonopin on occasion.  This is supplied through his primary care provider. Says sleep is doing well on Ambien '10mg'$  At bedtime  . Now on regular work schedule.  We discussed healthy sleep regimen alternatives for insomnia such as exercise yoga stress reducers. Patient says he is active.  We discussed healthy weight loss Is having trouble with insurance covering Ambien, wants an appeal.  PMP reviewed   Allergies  Allergen Reactions   Tetracyclines & Related    Zithromax [Azithromycin Dihydrate]     Immunization History  Administered Date(s) Administered   DTaP 09/03/1996, 11/08/1996, 01/07/1997, 12/29/1997, 07/10/2001   HIB (PRP-OMP) 09/03/1996, 11/08/1996, 01/07/1997, 10/07/1997   HPV Quadrivalent 11/19/2013, 12/20/2013   Hepatitis A 09/30/2005, 09/14/2006   Hepatitis B [birth year], 1995, 08/09/1996, 04/09/1997   IPV 09/03/1996, 11/08/1996, 12/29/1997, 07/10/2001   Influenza,inj,Quad PF,6+ Mos 10/29/2017, 11/07/2018, 10/18/2019, 11/11/2020   Influenza-Unspecified 10/23/2003, 10/12/2004, 11/09/2005, 11/11/2006, 10/21/2008, 09/07/2009,  10/03/2012, 11/17/2013   MMR 10/07/1997, 07/10/2001   Meningococcal Conjugate 09/30/2005, 10/03/2012   Moderna SARS-COV2 Booster Vaccination 01/01/2020   Moderna Sars-Covid-2 Vaccination 03/15/2019, 03/19/2019, 04/15/2019, 04/16/2019   PFIZER(Purple Top)SARS-COV-2 Vaccination 11/11/2020   Pneumococcal-Unspecified 04/16/1998, 07/03/1998   Td 07/25/2007   Tdap 07/25/2007, 05/12/2020   Varicella 07/15/1997, 09/14/2006    Past Medical History:  Diagnosis Date   Abdominal pain, recurrent    Abdominal pain, recurrent    Asthma    Chronic constipation    Very large hard stools   Chronic constipation    IBS (irritable bowel syndrome)     Tobacco History: Social History   Tobacco Use  Smoking Status Never  Smokeless Tobacco Never   Counseling given: Not Answered   Outpatient Medications Prior to Visit  Medication Sig Dispense Refill   albuterol (VENTOLIN HFA) 108 (90 Base) MCG/ACT inhaler Inhale into the lungs.     clonazePAM (KLONOPIN) 1 MG tablet Take 1 tablet (1 mg total) by mouth daily as needed for anxiety. 30 tablet 5   doxycycline (VIBRA-TABS) 100 MG tablet Take 1 tablet (100 mg total) by mouth 2 (two) times daily. 20 tablet 0   HYDROcodone bit-homatropine (HYCODAN) 5-1.5 MG/5ML syrup Take 5 mLs by mouth every 6 (six) hours as needed for up to 10 days. 180 mL 0   Melatonin 10 MG TABS Take 10 mg by mouth daily as needed.      PRESCRIPTION MEDICATION Ibgard 1 2x daily     trimethoprim-polymyxin b (POLYTRIM) ophthalmic solution Place 2 drops into both eyes every 4 (four) hours. 10 mL 0   zolpidem (AMBIEN) 10 MG tablet Take 1 tablet (10  mg total) by mouth at bedtime as needed for sleep. 30 tablet 2   No facility-administered medications prior to visit.     Review of Systems:   Constitutional:   No  weight loss, night sweats,  Fevers, chills, fatigue, or  lassitude.  HEENT:   No headaches,  Difficulty swallowing,  Tooth/dental problems, or  Sore throat,                No  sneezing, itching, ear ache, nasal congestion, post nasal drip,   CV:  No chest pain,  Orthopnea, PND, swelling in lower extremities, anasarca, dizziness, palpitations, syncope.   GI  No heartburn, indigestion, abdominal pain, nausea, vomiting, diarrhea, change in bowel habits, loss of appetite, bloody stools.   Resp: No shortness of breath with exertion or at rest.  No excess mucus, no productive cough,  No non-productive cough,  No coughing up of blood.  No change in color of mucus.  No wheezing.  No chest wall deformity  Skin: no rash or lesions.  GU: no dysuria, change in color of urine, no urgency or frequency.  No flank pain, no hematuria   MS:  No joint pain or swelling.  No decreased range of motion.  No back pain.    Physical Exam  BP 120/60 (BP Location: Left Arm, Patient Position: Sitting, Cuff Size: Large)   Pulse 100   Temp 97.7 F (36.5 C) (Oral)   Ht '5\' 5"'$  (1.651 m)   Wt (!) 334 lb 12.8 oz (151.9 kg)   SpO2 96%   BMI 55.71 kg/m   GEN: A/Ox3; pleasant , NAD, well nourished    HEENT:  [place]/AT,  NOSE-clear, THROAT-clear, no lesions, no postnasal drip or exudate noted.   NECK:  Supple w/ fair ROM; no JVD; normal carotid impulses w/o bruits; no thyromegaly or nodules palpated; no lymphadenopathy.    RESP  Clear  P & A; w/o, wheezes/ rales/ or rhonchi. no accessory muscle use, no dullness to percussion  CARD:  RRR, no m/r/g, no peripheral edema, pulses intact, no cyanosis or clubbing.  GI:   Soft & nt; nml bowel sounds; no organomegaly or masses detected.   Musco: Warm bil, no deformities or joint swelling noted.   Neuro: alert, no focal deficits noted.    Skin: Warm, no lesions or rashes    Lab Results:  CBC    BNP No results found for: "BNP"  ProBNP No results found for: "PROBNP"  Imaging: No results found.        No data to display          No results found for: "NITRICOXIDE"      Assessment & Plan:   Primary  insomnia Discussion regarding alternatives for insomnia , exercise , yoga, stress reducers- patient education given on long term use of sleep aides.  Continue on Ambien '10mg'$  At bedtime  As needed   Discussed strategies to decrease dose, trial of nights off of Ambien.  Patient says he can not sleep without it. Only gets a couple of hours if he does not use ambien.   Plan  Patient Instructions  Healthy sleep regimen  Ambien At bedtime  As needed  Insomnia .  Use caution with sedating medications.  Do not combine sedating medication  Do not drive if sleepy.  Follow up with Dr Ander Slade in 1 year and As needed         Rexene Edison, NP 03/30/2022

## 2022-03-30 NOTE — Patient Instructions (Addendum)
Healthy sleep regimen  Ambien At bedtime  As needed  Insomnia .  Use caution with sedating medications.  Do not combine sedating medication  Do not drive if sleepy.  Follow up with Dr Ander Slade in 1 year and As needed

## 2022-03-30 NOTE — Assessment & Plan Note (Signed)
Discussion regarding alternatives for insomnia , exercise , yoga, stress reducers- patient education given on long term use of sleep aides.  Continue on Ambien '10mg'$  At bedtime  As needed   Discussed strategies to decrease dose, trial of nights off of Ambien.  Patient says he can not sleep without it. Only gets a couple of hours if he does not use ambien.   Plan  Patient Instructions  Healthy sleep regimen  Ambien At bedtime  As needed  Insomnia .  Use caution with sedating medications.  Do not combine sedating medication  Do not drive if sleepy.  Follow up with Dr Ander Slade in 1 year and As needed

## 2022-04-25 ENCOUNTER — Telehealth: Payer: Self-pay | Admitting: *Deleted

## 2022-04-25 NOTE — Telephone Encounter (Signed)
ATC patient x1 regarding appeal process for Ambien.  Left detailed message that there is an authorized representative request form that has to be filled out, he can access it on [email or web address] or I can leave it up front for him to fill out.  Advised that all the forms do have to be faxed together and we will complete the other forms.  Requested that he call the office to let us know if he wants to pick up the form or do it online.  Will await return call.

## 2022-05-05 ENCOUNTER — Encounter: Payer: Self-pay | Admitting: Internal Medicine

## 2022-05-05 MED ORDER — CLONAZEPAM 1 MG PO TABS: 1.0000 mg | ORAL_TABLET | Freq: Every day | ORAL | 5 refills | Status: DC | PRN

## 2022-05-26 ENCOUNTER — Other Ambulatory Visit: Payer: Self-pay | Admitting: Pulmonary Disease

## 2022-05-26 MED ORDER — ZOLPIDEM TARTRATE 10 MG PO TABS: 10.0000 mg | ORAL_TABLET | Freq: Every evening | ORAL | 2 refills | Status: DC | PRN

## 2022-05-26 NOTE — Telephone Encounter (Signed)
Mychart message sent by pt:  Paul Parsons [facility] (supporting Tammy S Parrett, NP)3 hours ago (12:19 PM)    Hello!  I need a new prescription for the 10mg  Ambien sent [facility] to [facility] [address] [facility] whenever you guys get the chance. Thank you!    Dr. Val Eagle, please advise.

## 2022-08-22 ENCOUNTER — Other Ambulatory Visit: Payer: Self-pay | Admitting: Pulmonary Disease

## 2022-08-23 ENCOUNTER — Telehealth: Payer: Self-pay | Admitting: Pulmonary Disease

## 2022-08-23 MED ORDER — ZOLPIDEM TARTRATE 10 MG PO TABS: 10.0000 mg | ORAL_TABLET | Freq: Every evening | ORAL | 2 refills | Status: DC | PRN

## 2022-08-29 NOTE — Telephone Encounter (Signed)
Refill sent 08/23/2022.

## 2022-10-24 ENCOUNTER — Encounter: Payer: Self-pay | Admitting: Internal Medicine

## 2022-10-25 MED ORDER — CLONAZEPAM 1 MG PO TABS: 1.0000 mg | ORAL_TABLET | Freq: Every day | ORAL | 5 refills | Status: DC | PRN

## 2022-11-07 ENCOUNTER — Encounter: Payer: Self-pay | Admitting: Internal Medicine

## 2022-11-07 ENCOUNTER — Ambulatory Visit (INDEPENDENT_AMBULATORY_CARE_PROVIDER_SITE_OTHER): Payer: 59 | Admitting: Internal Medicine

## 2022-11-07 VITALS — BP 122/76 | HR 80 | Temp 98.3°F | Ht 65.0 in | Wt 332.0 lb

## 2022-11-07 DIAGNOSIS — F419 Anxiety disorder, unspecified: Secondary | ICD-10-CM | POA: Diagnosis not present

## 2022-11-07 DIAGNOSIS — E559 Vitamin D deficiency, unspecified: Secondary | ICD-10-CM | POA: Diagnosis not present

## 2022-11-07 DIAGNOSIS — J069 Acute upper respiratory infection, unspecified: Secondary | ICD-10-CM

## 2022-11-07 DIAGNOSIS — R739 Hyperglycemia, unspecified: Secondary | ICD-10-CM

## 2022-11-07 MED ORDER — DOXYCYCLINE HYCLATE 100 MG PO TABS: 100.0000 mg | ORAL_TABLET | Freq: Two times a day (BID) | ORAL | 0 refills | Status: DC

## 2022-11-07 MED ORDER — HYDROCODONE BIT-HOMATROP MBR 5-1.5 MG/5ML PO SOLN: 5.0000 mL | Freq: Four times a day (QID) | ORAL | 0 refills | Status: AC | PRN

## 2022-11-07 NOTE — Patient Instructions (Signed)
Please take all new medication as prescribed - the antibiotic, and cough medicine  Please continue all other medications as before, and refills have been done if requested.  Please have the pharmacy call with any other refills you may need.  Please keep your appointments with your specialists as you may have planned      

## 2022-11-07 NOTE — Progress Notes (Unsigned)
Patient ID: Paul Parsons, male   DOB: [birth year], 27 y.o.   MRN: [id number]        Chief Complaint: follow up uri symptoms, hyperglycemia, low vit d, morbid obesity       HPI:  Paul Parsons is a 27 y.o. male . Here with 2-3 days acute onset fever, facial pain, pressure, headache, general weakness and malaise, and greenish d/c, with mild ST and cough, but pt denies chest pain, wheezing, increased sob or doe, orthopnea, PND, increased LE swelling, palpitations, dizziness or syncope.   Pt denies polydipsia, polyuria, or new focal neuro s/s.   Difficult to lose wt but tyring to be more active  Denies worsening depressive symptoms, suicidal ideation, or panic; has ongoing anxiety,       Wt Readings from Last 3 Encounters:  11/07/22 (!) 332 lb (150.6 kg)  03/30/22 (!) 334 lb 12.8 oz (151.9 kg)  03/25/22 (!) 332 lb (150.6 kg)   BP Readings from Last 3 Encounters:  11/07/22 122/76  03/30/22 120/60  03/25/22 136/82         Past Medical History:  Diagnosis Date   Abdominal pain, recurrent    Abdominal pain, recurrent    Asthma    Chronic constipation    Very large hard stools   Chronic constipation    IBS (irritable bowel syndrome)    Past Surgical History:  Procedure Laterality Date   CHOLECYSTECTOMY     ERCP  2012   MOUTH SURGERY      x 2 , wisdom teeth    reports that he has never smoked. He has never used smokeless tobacco. He reports that he does not drink alcohol and does not use drugs. family history includes Breast cancer in his mother. Allergies  Allergen Reactions   Tetracyclines & Related    Zithromax [Azithromycin Dihydrate]    Current Outpatient Medications on File Prior to Visit  Medication Sig Dispense Refill   albuterol (VENTOLIN HFA) 108 (90 Base) MCG/ACT inhaler Inhale into the lungs.     clonazePAM (KLONOPIN) 1 MG tablet Take 1 tablet (1 mg total) by mouth daily as needed for anxiety. 30 tablet 5   Melatonin 10 MG TABS Take 10 mg by mouth daily as needed.       PRESCRIPTION MEDICATION Ibgard 1 2x daily     trimethoprim-polymyxin b (POLYTRIM) ophthalmic solution Place 2 drops into both eyes every 4 (four) hours. 10 mL 0   zolpidem (AMBIEN) 10 MG tablet Take 1 tablet (10 mg total) by mouth at bedtime as needed for sleep. 30 tablet 2   No current facility-administered medications on file prior to visit.        ROS:  All others reviewed and negative.  Objective        PE:  BP 122/76 (BP Location: Right Arm, Patient Position: Sitting, Cuff Size: Normal)   Pulse 80   Temp 98.3 F (36.8 C) (Oral)   Ht 5\' 5"  (1.651 m)   Wt (!) 332 lb (150.6 kg)   SpO2 98%   BMI 55.25 kg/m                 Constitutional: Pt appears in NAD               HENT: Head: NCAT.                Right Ear: External ear normal.  Left Ear: External ear normal. Bilat tm's with mild erythema.  Max sinus areas mild tender.  Pharynx with mild erythema, no exudate               Eyes: . Pupils are equal, round, and reactive to light. Conjunctivae and EOM are normal               Nose: without d/c or deformity               Neck: Neck supple. Gross normal ROM               Cardiovascular: Normal rate and regular rhythm.                 Pulmonary/Chest: Effort normal and breath sounds without rales or wheezing.                Abd:  Soft, NT, ND, + BS, no organomegaly               Neurological: Pt is alert. At baseline orientation, motor grossly intact               Skin: Skin is warm. No rashes, no other new lesions, LE edema - none               Psychiatric: Pt behavior is normal without agitation   Micro: none  Cardiac tracings I have personally interpreted today:  none  Pertinent Radiological findings (summarize): none   Lab Results  Component Value Date   WBC 9.7 01/14/2022   HGB 14.2 01/14/2022   HCT 44.5 01/14/2022   PLT 339 01/14/2022   GLUCOSE 91 01/14/2022   CHOL 149 01/14/2022   TRIG 144 01/14/2022   HDL 44 01/14/2022   LDLCALC 80 01/14/2022    ALT 46 (H) 01/14/2022   AST 22 01/14/2022   NA 140 01/14/2022   K 4.2 01/14/2022   CL 101 01/14/2022   CREATININE 1.37 (H) 01/14/2022   BUN 15 01/14/2022   CO2 20 01/14/2022   TSH 3.450 01/14/2022   HGBA1C 5.8 (H) 01/14/2022   Assessment/Plan:  Paul Parsons is a 27 y.o. White or Caucasian [1] male with  has a past medical history of Abdominal pain, recurrent, Abdominal pain, recurrent, Asthma, Chronic constipation, Chronic constipation, and IBS (irritable bowel syndrome).  Vitamin D deficiency Last vitamin D Lab Results  Component Value Date   VD25OH 10.9 (L) 01/14/2022   Low, to start oral replacement   Hyperglycemia Lab Results  Component Value Date   HGBA1C 5.8 (H) 01/14/2022   Stable, pt to continue current medical treatment  - diet, wt controlled  Acute upper respiratory infection Mild to mod, for antibx course doxycycline 100 bid, cough med prn,,  to f/u any worsening symptoms or concerns  Anxiety Stable overall, declines for change in tx or counseling  Followup: Return if symptoms worsen or fail to improve.  Oliver Barre, MD 11/08/2022 7:52 PM [facility]

## 2022-11-08 ENCOUNTER — Encounter: Payer: Self-pay | Admitting: Internal Medicine

## 2022-11-08 NOTE — Assessment & Plan Note (Signed)
Lab Results  Component Value Date   HGBA1C 5.8 (H) 01/14/2022   Stable, pt to continue current medical treatment  - diet, wt controlled

## 2022-11-08 NOTE — Assessment & Plan Note (Signed)
Mild to mod, for antibx course doxycycline 100 bid, cough med prn,,  to f/u any worsening symptoms or concerns  

## 2022-11-08 NOTE — Assessment & Plan Note (Signed)
Last vitamin D Lab Results  Component Value Date   VD25OH 10.9 (L) 01/14/2022   Low, to start oral replacement

## 2022-11-08 NOTE — Assessment & Plan Note (Signed)
Stable overall, declines for change in tx or counseling

## 2022-11-15 NOTE — Telephone Encounter (Signed)
LOV 03/30/22 Next OV - ROV 1 year Last refill 08/23/22, #30, 2 refills  Please review, thanks!

## 2022-11-16 MED ORDER — ZOLPIDEM TARTRATE 10 MG PO TABS: 10.0000 mg | ORAL_TABLET | Freq: Every evening | ORAL | 2 refills | Status: DC | PRN

## 2022-12-05 ENCOUNTER — Encounter: Payer: Self-pay | Admitting: Internal Medicine

## 2022-12-06 ENCOUNTER — Other Ambulatory Visit: Payer: Self-pay | Admitting: Internal Medicine

## 2022-12-06 MED ORDER — WEGOVY 0.25 MG/0.5ML ~~LOC~~ SOAJ: 0.2500 mg | SUBCUTANEOUS | 11 refills | Status: DC

## 2022-12-07 ENCOUNTER — Telehealth: Payer: Self-pay

## 2022-12-07 ENCOUNTER — Other Ambulatory Visit (HOSPITAL_COMMUNITY): Payer: Self-pay

## 2022-12-07 NOTE — Telephone Encounter (Signed)
Pharmacy Patient Advocate Encounter   Received notification from RX Request Messages that prior authorization for Wegovy 0.25mg /0.47ml is required/requested.   Insurance verification completed.   The patient is insured through [facility] .   Per test claim: PA required; PA submitted to above mentioned insurance via CoverMyMeds Key/confirmation #/EOC [id number] Status is pending

## 2022-12-07 NOTE — Telephone Encounter (Signed)
Pharmacy Patient Advocate Encounter   Received notification from RX Request Messages that prior authorization for Wegovy 0.25mg /0.58ml is required/requested.   Insurance verification completed.   The patient is insured through [facility] MEDICAID .   Per test claim: PA required; PA submitted to above mentioned insurance via CoverMyMeds Key/confirmation #/EOC [id number] Status is pending

## 2022-12-08 NOTE — Telephone Encounter (Signed)
Pharmacy Patient Advocate Encounter  Received notification from [facility] MEDICAID that Prior Authorization for Wegovy 0.25mg /0.75ml has been DENIED.  No reason given; No denial letter received via Fax or CMM. It has been requested and will be uploaded to the media tab once received.   PA #/Case ID/Reference #: [id number]

## 2022-12-08 NOTE — Telephone Encounter (Signed)
Pharmacy Patient Advocate Encounter  Received notification from [facility] that Prior Authorization for [facility] 0.25mg /0.47ml has been DENIED.  Full denial letter will be uploaded to the media tab. See denial reason below.   PA #/Case ID/Reference #: [id number]

## 2023-02-16 ENCOUNTER — Other Ambulatory Visit: Payer: Self-pay | Admitting: Pulmonary Disease

## 2023-02-16 ENCOUNTER — Telehealth: Payer: Self-pay | Admitting: Pulmonary Disease

## 2023-02-16 NOTE — Telephone Encounter (Signed)
Patient needs a refill of ambien 10mg   [facility] [address]

## 2023-02-16 NOTE — Telephone Encounter (Signed)
Called and spoke with patient.  Patient requested a new Ambien 10mg  prescription to be sent to [facility].  Ambien 10mg  #30, 2 refills last prescribed 11/16/22.  Message routed to Dr. Wynona Neat to advise

## 2023-02-17 ENCOUNTER — Other Ambulatory Visit: Payer: Self-pay | Admitting: Acute Care

## 2023-02-17 DIAGNOSIS — G4709 Other insomnia: Secondary | ICD-10-CM

## 2023-02-17 MED ORDER — ZOLPIDEM TARTRATE 10 MG PO TABS: 10.0000 mg | ORAL_TABLET | Freq: Every evening | ORAL | 0 refills | Status: DC | PRN

## 2023-02-17 NOTE — Telephone Encounter (Signed)
PT has left two MYCHART messages and is calling again. He wants to be sure he has Ambien for the weekend. Due to time sensitive sending back HP.   Message:  Hello! I am just following up again with another message about the ambien refill. I am unable to send a message to Dr. Wynona Neat and I am reaching out because I need a new [facility] [address] for my Ambien prescription due to being out of refills. Thank you so much!

## 2023-02-17 NOTE — Telephone Encounter (Signed)
Bevelyn Ngo, NP to Gay Filler T, CMA  Lbpu Triage [facility]     02/17/23  1:50 PM  Refill sent  I called and left detailed msg letting him know  Nothing further needed

## 2023-02-17 NOTE — Telephone Encounter (Signed)
Request sent to DOD since Dr Wynona Neat is off.

## 2023-02-17 NOTE — Telephone Encounter (Signed)
I called and spoke with the pt. Pt states he needs a new RX sent in due to running out of refills. [facility] [address]   I called and spoke Jacquelyn Scientist, research (physical sciences)) from [facility], and she states he needs a new rx and refills for this. 10mg  by mouth HS PRN.   Pt was last seen by Babette Relic, NP on 03-30-22- was stated to continue this. Pt is rx Ambien from Dr Wynona Neat. Last refilled on 11-16-22.   Please advise.

## 2023-02-21 NOTE — Telephone Encounter (Signed)
Pt is requesting medication refill for ambien. He was last seen on 03/30/22. Please advise Tammy Parrett.

## 2023-03-09 ENCOUNTER — Encounter: Payer: Self-pay | Admitting: Internal Medicine

## 2023-03-09 ENCOUNTER — Ambulatory Visit: Payer: Medicaid Other | Admitting: Internal Medicine

## 2023-03-09 VITALS — BP 130/84 | HR 83 | Temp 98.3°F | Ht 65.0 in | Wt 327.0 lb

## 2023-03-09 DIAGNOSIS — J309 Allergic rhinitis, unspecified: Secondary | ICD-10-CM

## 2023-03-09 DIAGNOSIS — E559 Vitamin D deficiency, unspecified: Secondary | ICD-10-CM

## 2023-03-09 DIAGNOSIS — J069 Acute upper respiratory infection, unspecified: Secondary | ICD-10-CM

## 2023-03-09 DIAGNOSIS — R739 Hyperglycemia, unspecified: Secondary | ICD-10-CM

## 2023-03-09 DIAGNOSIS — Z23 Encounter for immunization: Secondary | ICD-10-CM

## 2023-03-09 DIAGNOSIS — R6889 Other general symptoms and signs: Secondary | ICD-10-CM | POA: Diagnosis not present

## 2023-03-09 LAB — POCT INFLUENZA A/B
Influenza A, POC: NEGATIVE
Influenza B, POC: NEGATIVE

## 2023-03-09 LAB — POCT RAPID STREP A (OFFICE): Rapid Strep A Screen: NEGATIVE

## 2023-03-09 LAB — POC COVID19 BINAXNOW: SARS Coronavirus 2 Ag: NEGATIVE

## 2023-03-09 MED ORDER — DOXYCYCLINE HYCLATE 100 MG PO TABS: 100.0000 mg | ORAL_TABLET | Freq: Two times a day (BID) | ORAL | 0 refills | Status: DC

## 2023-03-09 MED ORDER — HYDROCODONE BIT-HOMATROP MBR 5-1.5 MG/5ML PO SOLN: 5.0000 mL | Freq: Four times a day (QID) | ORAL | 0 refills | Status: AC | PRN

## 2023-03-09 NOTE — Assessment & Plan Note (Signed)
Last vitamin D Lab Results  Component Value Date   VD25OH 10.9 (L) 01/14/2022   Low, to start oral replacement

## 2023-03-09 NOTE — Assessment & Plan Note (Signed)
Lab Results  Component Value Date   HGBA1C 5.8 (H) 01/14/2022   Stable, pt to continue current medical treatment  - diet, wt control

## 2023-03-09 NOTE — Assessment & Plan Note (Signed)
Mild to mod, for otc allegra and/or nasacort asd,,  to f/u any worsening symptoms or concerns

## 2023-03-09 NOTE — Assessment & Plan Note (Signed)
 Mild to mod, for antibx course - doxycycline 100 bid, cough med prn, to f/u any worsening symptoms or concerns

## 2023-03-09 NOTE — Patient Instructions (Signed)
 Please take all new medication as prescribed - the antibiotic, and cough medicine as needed  Please continue all other medications as before, and refills have been done if requested.  Please have the pharmacy call with any other refills you may need.  Please keep your appointments with your specialists as you may have planned

## 2023-03-09 NOTE — Progress Notes (Signed)
Patient ID: Paul Parsons, male   DOB: [birth year], 28 y.o.   MRN: [id number]        Chief Complaint: follow up sinusitis, allergies, low vit d, hyperglycemia       HPI:  Paul Parsons is a 28 y.o. male  Here with 2 days acute onset fever, facial pain, pressure, headache, general weakness and malaise, and greenish d/c, with mild ST and cough, but pt denies chest pain, wheezing, increased sob or doe, orthopnea, PND, increased LE swelling, palpitations, dizziness or syncope.  Does have several wks ongoing nasal allergy symptoms with clearish congestion, itch and sneezing, without fever, pain, ST, cough, swelling or wheezing.       Wt Readings from Last 3 Encounters:  03/09/23 (!) 327 lb (148.3 kg)  11/07/22 (!) 332 lb (150.6 kg)  03/30/22 (!) 334 lb 12.8 oz (151.9 kg)   BP Readings from Last 3 Encounters:  03/09/23 130/84  11/07/22 122/76  03/30/22 120/60         Past Medical History:  Diagnosis Date   Abdominal pain, recurrent    Abdominal pain, recurrent    Asthma    Chronic constipation    Very large hard stools   Chronic constipation    IBS (irritable bowel syndrome)    Past Surgical History:  Procedure Laterality Date   CHOLECYSTECTOMY     ERCP  2012   MOUTH SURGERY      x 2 , wisdom teeth    reports that he has never smoked. He has never used smokeless tobacco. He reports that he does not drink alcohol and does not use drugs. family history includes Breast cancer in his mother. Allergies  Allergen Reactions   Tetracyclines & Related    Zithromax [Azithromycin Dihydrate]    Current Outpatient Medications on File Prior to Visit  Medication Sig Dispense Refill   albuterol (VENTOLIN HFA) 108 (90 Base) MCG/ACT inhaler Inhale into the lungs.     clonazePAM (KLONOPIN) 1 MG tablet Take 1 tablet (1 mg total) by mouth daily as needed for anxiety. 30 tablet 5   Melatonin 10 MG TABS Take 10 mg by mouth daily as needed.      PRESCRIPTION MEDICATION Ibgard 1 2x daily      Semaglutide-Weight Management (WEGOVY) 0.25 MG/0.5ML SOAJ Inject 0.25 mg into the skin once a week. 2 mL 11   trimethoprim-polymyxin b (POLYTRIM) ophthalmic solution Place 2 drops into both eyes every 4 (four) hours. 10 mL 0   zolpidem (AMBIEN) 10 MG tablet Take 1 tablet (10 mg total) by mouth at bedtime as needed for sleep. 30 tablet 2   zolpidem (AMBIEN) 10 MG tablet Take 1 tablet (10 mg total) by mouth at bedtime as needed for sleep. 30 tablet 0   No current facility-administered medications on file prior to visit.        ROS:  All others reviewed and negative.  Objective        PE:  BP 130/84 (BP Location: Left Arm, Patient Position: Sitting, Cuff Size: Normal)   Pulse 83   Temp 98.3 F (36.8 C) (Oral)   Ht 5\' 5"  (1.651 m)   Wt (!) 327 lb (148.3 kg)   SpO2 91%   BMI 54.42 kg/m                 Constitutional: Pt appears in NAD               HENT: Head: NCAT.  Right Ear: External ear normal.                 Left Ear: External ear normal. Bilat tm's with mild erythema.  Max sinus areas mild tender.  Pharynx with mild erythema, no exudate               Eyes: . Pupils are equal, round, and reactive to light. Conjunctivae and EOM are normal               Nose: without d/c or deformity               Neck: Neck supple. Gross normal ROM               Cardiovascular: Normal rate and regular rhythm.                 Pulmonary/Chest: Effort normal and breath sounds without rales or wheezing.                Abd:  Soft, NT, ND, + BS, no organomegaly               Neurological: Pt is alert. At baseline orientation, motor grossly intact               Skin: Skin is warm. No rashes, no other new lesions, LE edema - none               Psychiatric: Pt behavior is normal without agitation   Micro: none  Cardiac tracings I have personally interpreted today:  none  Pertinent Radiological findings (summarize): none   Lab Results  Component Value Date   WBC 9.7 01/14/2022   HGB  14.2 01/14/2022   HCT 44.5 01/14/2022   PLT 339 01/14/2022   GLUCOSE 91 01/14/2022   CHOL 149 01/14/2022   TRIG 144 01/14/2022   HDL 44 01/14/2022   LDLCALC 80 01/14/2022   ALT 46 (H) 01/14/2022   AST 22 01/14/2022   NA 140 01/14/2022   K 4.2 01/14/2022   CL 101 01/14/2022   CREATININE 1.37 (H) 01/14/2022   BUN 15 01/14/2022   CO2 20 01/14/2022   TSH 3.450 01/14/2022   HGBA1C 5.8 (H) 01/14/2022   POCT - strep -neg, covid - neg, flu - neg  Assessment/Plan:  Paul Parsons is a 28 y.o. White or Caucasian [1] male with  has a past medical history of Abdominal pain, recurrent, Abdominal pain, recurrent, Asthma, Chronic constipation, Chronic constipation, and IBS (irritable bowel syndrome).  Acute upper respiratory infection Mild to mod, for antibx course doxycycline 100 bid, cough med prn,  to f/u any worsening symptoms or concerns  Allergic rhinitis Mild to mod, for otc allegra and or nasacort asd,,  to f/u any worsening symptoms or concerns  Vitamin D deficiency Last vitamin D Lab Results  Component Value Date   VD25OH 10.9 (L) 01/14/2022   Low, to start oral replacement   Hyperglycemia Lab Results  Component Value Date   HGBA1C 5.8 (H) 01/14/2022   Stable, pt to continue current medical treatment  - diet, wt control  Followup: Return if symptoms worsen or fail to improve.  Paul Barre, MD 03/09/2023 4:27 PM [facility] - [facility]

## 2023-03-10 ENCOUNTER — Ambulatory Visit: Payer: Self-pay | Admitting: Internal Medicine

## 2023-03-10 ENCOUNTER — Encounter: Payer: Self-pay | Admitting: Internal Medicine

## 2023-03-10 MED ORDER — PREDNISONE 10 MG PO TABS: ORAL_TABLET | ORAL | 0 refills | Status: DC

## 2023-03-10 NOTE — Telephone Encounter (Signed)
Copied From CRM [id number]. Reason for Triage: Increased congestion causing patient's cough to increase. Congestion is causing sinus pain and excessive drainage. Patient was instructed to call back before closing to get an update on receiving prednisone prescription but clinics are closed.    Patient's mother called to check status of a prednisone prescription that was discussed when the patient was seen yesterday. I advised her that it was recently signed and should have been sent to the pharmacy. She had no further questions at this time.    Reason for Disposition  [1] Prescription prescribed recently is not at pharmacy AND [2] triager has access to patient's EMR AND [3] prescription is recorded in the EMR  Answer Assessment - Initial Assessment Questions 1. NAME of MEDICINE: "What medicine(s) are you calling about?"     Prednisone  2. QUESTION: "What is your question?" (e.g., double dose of medicine, side effect)     Wanted to see if it was prescribed  3. PRESCRIBER: "Who prescribed the medicine?" Reason: if prescribed by specialist, call should be referred to that group.     Dr. Jonny Ruiz  Protocols used: Medication Question Call-A-AH

## 2023-03-14 ENCOUNTER — Ambulatory Visit: Payer: Self-pay | Admitting: Internal Medicine

## 2023-03-14 NOTE — Telephone Encounter (Signed)
  Chief Complaint: Nausea Symptoms: n/v Frequency: off and on Pertinent Negatives: Patient denies Fever, diarrhea, coffee ground emesis, abdominal apin Disposition: [] ED /[] Urgent Care (no appt availability in office) / [x] Appointment(In office/virtual)/ []  [place] [facility]/ [] Home Care/ [x] Refused Recommended Disposition /[] [place] [facility]/ []  Follow-up with PCP Additional Notes: Patient called with complaints of nausea after taking Doxycyline. Patient states he was prescribed the abx on Friday and has been having nausea with one emesis today since a few days ago. Patient states he is able to tolerate fluids for the most part with nausea rated at  a 3-4/10 with drinking, but states he has 6-7/10 nausea after eating food. Patient states he always have problems with nausea when taking doxycycline and is pretty confident that is the cause of his symptoms. Patient denies fever, abdominal pain, or diarrhea. Patient advised by this RN that he should be seen within 24 hours per protocol, to which patient refused and asked if PCP could call something in for him for nausea. Patient advised by this RN note will be routed to office for PCP review and to call back with worsening symptoms. Patient verbalized understanding   Copied from CRM [id number]. Topic: Clinical - Red Word Triage >> Mar 14, 2023  3:42 PM Pascal Lux wrote: Red Word that prompted transfer to Nurse Triage: Patient stated he is having an adverse reaction to doxycycline (VIBRA-TABS) 100 MG tablet [[id number]] - nausea Reason for Disposition  Taking prescription medication that could cause nausea (e.g., narcotics/opiates, antibiotics, OCPs, many others)  Answer Assessment - Initial Assessment Questions 1. NAUSEA SEVERITY: "How bad is the nausea?" (e.g., mild, moderate, severe; dehydration, weight loss)   - MILD: loss of appetite without change in eating habits   - MODERATE: decreased oral intake without significant weight loss,  dehydration, or malnutrition   - SEVERE: inadequate caloric or fluid intake, significant weight loss, symptoms of dehydration     After eating food 6 or 7/10, after drinking 3 or 4/10 2. ONSET: "When did the nausea begin?"   2-3 days ago. 3. VOMITING: "Any vomiting?" If Yes, ask: "How many times today?"     Once this morning.  4. RECURRENT SYMPTOM: "Have you had nausea before?" If Yes, ask: "When was the last time?" "What happened that time?"     "Last year when I was last on it [doxycycline]" 5. CAUSE: "What do you think is causing the nausea?"     "I always have nausea problems when I take doxycycline."  Protocols used: Nausea-A-AH

## 2023-03-15 ENCOUNTER — Encounter: Payer: Self-pay | Admitting: Internal Medicine

## 2023-03-15 MED ORDER — ONDANSETRON 4 MG PO TBDP: 4.0000 mg | ORAL_TABLET | Freq: Three times a day (TID) | ORAL | 1 refills | Status: DC | PRN

## 2023-03-15 NOTE — Addendum Note (Signed)
 Addended by: Corwin Levins on: 03/15/2023 12:59 PM   Modules accepted: Orders

## 2023-03-23 ENCOUNTER — Encounter: Payer: Self-pay | Admitting: Internal Medicine

## 2023-03-23 DIAGNOSIS — G4709 Other insomnia: Secondary | ICD-10-CM

## 2023-03-24 MED ORDER — ZOLPIDEM TARTRATE 10 MG PO TABS: 10.0000 mg | ORAL_TABLET | Freq: Every evening | ORAL | 1 refills | Status: DC | PRN

## 2023-04-18 ENCOUNTER — Encounter: Payer: Self-pay | Admitting: Internal Medicine

## 2023-04-19 MED ORDER — CLONAZEPAM 1 MG PO TABS: 1.0000 mg | ORAL_TABLET | Freq: Every day | ORAL | 5 refills | Status: DC | PRN

## 2023-05-10 ENCOUNTER — Other Ambulatory Visit: Payer: Self-pay | Admitting: Internal Medicine

## 2023-05-10 ENCOUNTER — Other Ambulatory Visit (HOSPITAL_COMMUNITY): Payer: Self-pay

## 2023-05-10 ENCOUNTER — Ambulatory Visit (INDEPENDENT_AMBULATORY_CARE_PROVIDER_SITE_OTHER): Admitting: Internal Medicine

## 2023-05-10 ENCOUNTER — Encounter: Payer: Self-pay | Admitting: Internal Medicine

## 2023-05-10 ENCOUNTER — Telehealth: Payer: Self-pay

## 2023-05-10 VITALS — BP 130/80 | HR 79 | Temp 98.3°F | Ht 65.0 in | Wt 330.0 lb

## 2023-05-10 DIAGNOSIS — J069 Acute upper respiratory infection, unspecified: Secondary | ICD-10-CM | POA: Diagnosis not present

## 2023-05-10 DIAGNOSIS — Z0001 Encounter for general adult medical examination with abnormal findings: Secondary | ICD-10-CM

## 2023-05-10 DIAGNOSIS — J309 Allergic rhinitis, unspecified: Secondary | ICD-10-CM

## 2023-05-10 DIAGNOSIS — E559 Vitamin D deficiency, unspecified: Secondary | ICD-10-CM

## 2023-05-10 DIAGNOSIS — E669 Obesity, unspecified: Secondary | ICD-10-CM | POA: Diagnosis not present

## 2023-05-10 DIAGNOSIS — R739 Hyperglycemia, unspecified: Secondary | ICD-10-CM

## 2023-05-10 DIAGNOSIS — T7840XA Allergy, unspecified, initial encounter: Secondary | ICD-10-CM

## 2023-05-10 MED ORDER — METHYLPREDNISOLONE ACETATE 80 MG/ML IJ SUSP
80.0000 mg | Freq: Once | INTRAMUSCULAR | Status: AC
  Administered 2023-05-10: 80 mg via INTRAMUSCULAR

## 2023-05-10 MED ORDER — TIRZEPATIDE-WEIGHT MANAGEMENT 2.5 MG/0.5ML ~~LOC~~ SOLN: 2.5000 mg | SUBCUTANEOUS | 11 refills | Status: DC

## 2023-05-10 MED ORDER — TRIAMCINOLONE ACETONIDE 55 MCG/ACT NA AERO: 2.0000 | INHALATION_SPRAY | Freq: Every day | NASAL | 12 refills | Status: AC

## 2023-05-10 MED ORDER — AZITHROMYCIN 250 MG PO TABS
ORAL_TABLET | ORAL | 1 refills | Status: AC
Start: 2023-05-10 — End: 2023-05-15

## 2023-05-10 MED ORDER — PREDNISONE 10 MG PO TABS: ORAL_TABLET | ORAL | 0 refills | Status: DC

## 2023-05-10 NOTE — Patient Instructions (Signed)
 You had the steroid shot today  Please take all new medication as prescribed - the antibiotic, prednisone , and nasacort   You can also take Delsym OTC for cough, and/or Mucinex (or it's generic off brand) for congestion, and tylenol as needed for pain.  Please take all new medication as prescribed - the Zepbound 2.5 mg weekly , and call in 1 month if you are doing ok with the nausea, to have the dose increased to 5 mg  Please continue all other medications as before, and refills have been done if requested.  Please have the pharmacy call with any other refills you may need.  Please continue your efforts at being more active, low cholesterol diet, and weight control.  You are otherwise up to date with prevention measures today.  Please keep your appointments with your specialists as you may have planned  Please go to the LAB at the blood drawing area for the tests to be done  You will be contacted by phone if any changes need to be made immediately.  Otherwise, you will receive a letter about your results with an explanation, but please check with MyChart first.  Please make an Appointment to return in 6 months, or sooner if needed

## 2023-05-10 NOTE — Assessment & Plan Note (Signed)
 Lab Results  Component Value Date   HGBA1C 5.8 (H) 01/14/2022   Stable, pt to continue current medical treatment - diet, wt control but recheck A1c with labs

## 2023-05-10 NOTE — Assessment & Plan Note (Signed)
Last vitamin D Lab Results  Component Value Date   VD25OH 10.9 (L) 01/14/2022   Low, to start oral replacement

## 2023-05-10 NOTE — Progress Notes (Signed)
 Patient ID: Paul Parsons, male   DOB: Jun 08, 1995, 28 y.o.   MRN: [id number]         Chief Complaint:: wellness exam and [place], allergie,s low vit d, obesity with hyperglycemia       HPI:  Paul Parsons is a 28 y.o. male here for wellness exam; declines hpv vax and hep c screening, o/w up to date                        Also  Here with 2-3 days acute onset fever, facial pain, pressure, headache, general weakness and malaise, and greenish d/c, with mild ST and cough, but pt denies chest pain, wheezing, increased sob or doe, orthopnea, PND, increased LE swelling, palpitations, dizziness or syncope.  Does have several wks ongoing nasal allergy symptoms with clearish congestion, itch and sneezing, without fever, pain, ST, cough, swelling or wheezing. Difficult to lose wt.   Wt Readings from Last 3 Encounters:  05/10/23 (!) 330 lb (149.7 kg)  03/09/23 (!) 327 lb (148.3 kg)  11/07/22 (!) 332 lb (150.6 kg)   BP Readings from Last 3 Encounters:  05/10/23 130/80  03/09/23 130/84  11/07/22 122/76   Immunization History  Administered Date(s) Administered   DTaP 09/03/1996, 11/08/1996, 01/07/1997, 12/29/1997, 07/10/2001   HIB (PRP-OMP) 09/03/1996, 11/08/1996, 01/07/1997, 10/07/1997   HPV Quadrivalent 11/19/2013, 12/20/2013   Hepatitis A 09/30/2005, 09/14/2006   Hepatitis B 01-Feb-1996, 08/09/1996, 04/09/1997   IPV 09/03/1996, 11/08/1996, 12/29/1997, 07/10/2001   Influenza,inj,Quad PF,6+ Mos 10/29/2017, 11/07/2018, 10/18/2019, 11/11/2020   Influenza-Unspecified 10/23/2003, 10/12/2004, 11/09/2005, 11/11/2006, 10/21/2008, 09/07/2009, 10/03/2012, 11/17/2013   MMR 10/07/1997, 07/10/2001   Meningococcal Conjugate 09/30/2005, 10/03/2012   Moderna SARS-COV2 Booster Vaccination 01/01/2020   Moderna Sars-Covid-2 Vaccination 03/15/2019, 03/19/2019, 04/15/2019, 04/16/2019   PFIZER(Purple Top)SARS-COV-2 Vaccination 11/11/2020   Pneumococcal-Unspecified 04/16/1998, 07/03/1998   Td 07/25/2007   Tdap 07/25/2007,  05/12/2020   Varicella 07/15/1997, 09/14/2006   Health Maintenance Due  Topic Date Due   HPV VACCINES (3 - Male 3-dose series) 05/20/2014   Hepatitis C Screening  Never done      Past Medical History:  Diagnosis Date   Abdominal pain, recurrent    Abdominal pain, recurrent    Asthma    Chronic constipation    Very large hard stools   Chronic constipation    IBS (irritable bowel syndrome)    Past Surgical History:  Procedure Laterality Date   CHOLECYSTECTOMY     ERCP  2012   MOUTH SURGERY      x 2 , wisdom teeth    reports that he has never smoked. He has never used smokeless tobacco. He reports that he does not drink alcohol and does not use drugs. family history includes Breast cancer in his mother. Allergies  Allergen Reactions   Tetracyclines & Related    Zithromax  [Azithromycin  Dihydrate]    Current Outpatient Medications on File Prior to Visit  Medication Sig Dispense Refill   albuterol  (VENTOLIN  HFA) 108 (90 Base) MCG/ACT inhaler Inhale into the lungs.     clonazePAM  (KLONOPIN ) 1 MG tablet Take 1 tablet (1 mg total) by mouth daily as needed for anxiety. 30 tablet 5   doxycycline  (VIBRA -TABS) 100 MG tablet Take 1 tablet (100 mg total) by mouth 2 (two) times daily. 20 tablet 0   Melatonin 10 MG TABS Take 10 mg by mouth daily as needed.      ondansetron  (ZOFRAN -ODT) 4 MG disintegrating tablet Take 1 tablet (4 mg total) by mouth  every 8 (eight) hours as needed. 30 tablet 1   PRESCRIPTION MEDICATION Ibgard 1 2x daily     Semaglutide -Weight Management (WEGOVY ) 0.25 MG/0.5ML SOAJ Inject 0.25 mg into the skin once a week. 2 mL 11   trimethoprim -polymyxin b  (POLYTRIM ) ophthalmic solution Place 2 drops into both eyes every 4 (four) hours. 10 mL 0   zolpidem  (AMBIEN ) 10 MG tablet Take 1 tablet (10 mg total) by mouth at bedtime as needed for sleep. 30 tablet 2   zolpidem  (AMBIEN ) 10 MG tablet Take 1 tablet (10 mg total) by mouth at bedtime as needed for sleep. 90 tablet 1    No current facility-administered medications on file prior to visit.        ROS:  All others reviewed and negative.  Objective        PE:  BP 130/80   Pulse 79   Temp 98.3 F (36.8 C) (Temporal)   Ht 5\' 5"  (1.651 m)   Wt (!) 330 lb (149.7 kg)   SpO2 98%   BMI 54.91 kg/m                 Constitutional: Pt appears mild ill               HENT: Head: NCAT.                Right Ear: External ear normal.                 Left Ear: External ear normal. Bilat tm's with mild erythema.  Max sinus areas non tender.  Pharynx with mild erythema, no exudate                Eyes: . Pupils are equal, round, and reactive to light. Conjunctivae and EOM are normal               Nose: without d/c or deformity               Neck: Neck supple. Gross normal ROM               Cardiovascular: Normal rate and regular rhythm.                 Pulmonary/Chest: Effort normal and breath sounds without rales or wheezing.                Abd:  Soft, NT, ND, + BS, no organomegaly               Neurological: Pt is alert. At baseline orientation, motor grossly intact               Skin: Skin is warm. No rashes, no other new lesions, LE edema - none               Psychiatric: Pt behavior is normal without agitation   Micro: none  Cardiac tracings I have personally interpreted today:  none  Pertinent Radiological findings (summarize): none   Lab Results  Component Value Date   WBC 9.7 01/14/2022   HGB 14.2 01/14/2022   HCT 44.5 01/14/2022   PLT 339 01/14/2022   GLUCOSE 91 01/14/2022   CHOL 149 01/14/2022   TRIG 144 01/14/2022   HDL 44 01/14/2022   LDLCALC 80 01/14/2022   ALT 46 (H) 01/14/2022   AST 22 01/14/2022   NA 140 01/14/2022   K 4.2 01/14/2022   CL 101 01/14/2022   CREATININE 1.37 (H) 01/14/2022   BUN 15  01/14/2022   CO2 20 01/14/2022   TSH 3.450 01/14/2022   HGBA1C 5.8 (H) 01/14/2022   Assessment/Plan:  Paul Parsons is a 28 y.o. White or Caucasian [1] male with  has a past medical  history of Abdominal pain, recurrent, Abdominal pain, recurrent, Asthma, Chronic constipation, Chronic constipation, and IBS (irritable bowel syndrome).  Encounter for well adult exam with abnormal findings Age and sex appropriate education and counseling updated with regular exercise and diet Referrals for preventative services - declines hep c screen Immunizations addressed - declines hpv vax Smoking counseling  - none needed Evidence for depression or other mood disorder - none significant Most recent labs reviewed. I have personally reviewed and have noted: 1) the patient's medical and social history 2) The patient's current medications and supplements 3) The patient's height, weight, and BMI have been recorded in the chart   Obesity Morbid obese in the setting of hx of hyperglycemia - for zepbound 2.5 mg if ok with insurance  Hyperglycemia Lab Results  Component Value Date   HGBA1C 5.8 (H) 01/14/2022   Stable, pt to continue current medical treatment - diet, wt control but recheck A1c with labs   Acute upper respiratory infection Mild to mod, for antibx course zpack,  to f/u any worsening symptoms or concerns  Vitamin D  deficiency Last vitamin D  Lab Results  Component Value Date   VD25OH 10.9 (L) 01/14/2022   Low, to start oral replacement   Allergic rhinitis With marked flare, for depomedrol 80 mg IM, prednisone  taper, add nasacort , cont xyzal 5 mg qd  Followup: Return in about 6 months (around 11/09/2023).  Rosalia Colonel, MD 05/10/2023 1:07 PM [facility] [facility] - [facility]

## 2023-05-10 NOTE — Assessment & Plan Note (Signed)
 Age and sex appropriate education and counseling updated with regular exercise and diet Referrals for preventative services - declines hep c screen Immunizations addressed - declines hpv vax Smoking counseling  - none needed Evidence for depression or other mood disorder - none significant Most recent labs reviewed. I have personally reviewed and have noted: 1) the patient's medical and social history 2) The patient's current medications and supplements 3) The patient's height, weight, and BMI have been recorded in the chart

## 2023-05-10 NOTE — Assessment & Plan Note (Signed)
Mild to mod, for antibx course zpack,  to f/u any worsening symptoms or concerns 

## 2023-05-10 NOTE — Assessment & Plan Note (Signed)
 With marked flare, for depomedrol 80 mg IM, prednisone  taper, add nasacort , cont xyzal 5 mg qd

## 2023-05-10 NOTE — Assessment & Plan Note (Signed)
 Morbid obese in the setting of hx of hyperglycemia - for zepbound 2.5 mg if ok with insurance

## 2023-05-11 ENCOUNTER — Ambulatory Visit: Admitting: Internal Medicine

## 2023-05-11 ENCOUNTER — Telehealth: Payer: Self-pay

## 2023-05-11 ENCOUNTER — Other Ambulatory Visit (HOSPITAL_COMMUNITY): Payer: Self-pay

## 2023-05-11 NOTE — Telephone Encounter (Signed)
 Pharmacy Patient Advocate Encounter   Received notification from Pt Calls Messages that prior authorization for Zepbound 2.5mg /0.72ml is required/requested.   Insurance verification completed.   The patient is insured through [facility] MEDICAID .   Per test claim: PA required; PA submitted to above mentioned insurance via CoverMyMeds Key/confirmation #/EOC [id number] Status is pending

## 2023-05-11 NOTE — Telephone Encounter (Signed)
 Pharmacy Patient Advocate Encounter  Received notification from [facility] MEDICAID that Prior Authorization for Zepbound 2.5mg /0.60ml has been DENIED.  See denial reason below. No denial letter attached in CMM. Will attach denial letter to Media tab once received.   PA #/Case ID/Reference #:  [id number]

## 2023-05-11 NOTE — Telephone Encounter (Signed)
 Paul Parsons

## 2023-05-12 ENCOUNTER — Encounter: Payer: Self-pay | Admitting: Internal Medicine

## 2023-06-21 ENCOUNTER — Encounter: Payer: Self-pay | Admitting: Internal Medicine

## 2023-06-22 MED ORDER — TIRZEPATIDE-WEIGHT MANAGEMENT 2.5 MG/0.5ML ~~LOC~~ SOLN: 2.5000 mg | SUBCUTANEOUS | 11 refills | Status: DC

## 2023-07-12 ENCOUNTER — Encounter: Payer: Self-pay | Admitting: Internal Medicine

## 2023-07-12 MED ORDER — TIRZEPATIDE-WEIGHT MANAGEMENT 5 MG/0.5ML ~~LOC~~ SOLN: 5.0000 mg | SUBCUTANEOUS | 11 refills | Status: DC

## 2023-08-01 ENCOUNTER — Encounter: Payer: Self-pay | Admitting: Internal Medicine

## 2023-08-02 MED ORDER — ONDANSETRON 4 MG PO TBDP: 4.0000 mg | ORAL_TABLET | Freq: Three times a day (TID) | ORAL | 1 refills | Status: DC | PRN

## 2023-08-03 ENCOUNTER — Ambulatory Visit: Payer: Self-pay

## 2023-08-03 ENCOUNTER — Telehealth: Payer: Self-pay | Admitting: Internal Medicine

## 2023-08-03 NOTE — Telephone Encounter (Signed)
 1st attempt. Left voicemail to call back.      Copied from CRM [id number]. Topic: Clinical - Medication Question >> Aug 03, 2023 11:47 AM Mia F wrote: Reason for CRM: Pt mom called and said pt wants to stay on medication. He is afraid what dosage to do next because he has had such stomach issues since the dosage has increased this week. he is scared what it can do. He is nauseous and having constipation. Mom had him taking a lot of Collace and full cups of Miralax. He need guidance for what to do for the next weeks dosage

## 2023-08-03 NOTE — Telephone Encounter (Signed)
 See nurse triage

## 2023-08-03 NOTE — Telephone Encounter (Signed)
 Mother states that son cannot answer the phone during the day d/t work. Mother states that he is at work until [employer]. Pt mother calling states that pt wants to stay on the medication that he is utilizing to lose weight. Mother states that it has been working for losing weight but it has also constipated him. RN attempted edu on constipation, phones were disconnected during edu. Unable to reach the mother no number in the chart for the mother.

## 2023-08-03 NOTE — Telephone Encounter (Signed)
 Copied from CRM [id number]. Topic: Clinical - Medication Question >> Aug 03, 2023 11:47 AM Mia F wrote: Reason for CRM: Pt mom called and said pt wants to stay on medication. He is afraid what dosage to do next because he has had such stomach issues since the dosage has increased this week. he is scared what it can do. He is nauseous and having constipation. Mom had him taking a lot of Collace and full cups of Miralax. He need guidance for what to do for the next weeks dosage

## 2023-08-04 ENCOUNTER — Ambulatory Visit (INDEPENDENT_AMBULATORY_CARE_PROVIDER_SITE_OTHER): Admitting: Internal Medicine

## 2023-08-04 ENCOUNTER — Encounter: Payer: Self-pay | Admitting: Internal Medicine

## 2023-08-04 VITALS — BP 126/72 | HR 66 | Temp 98.1°F | Ht 65.0 in | Wt 310.0 lb

## 2023-08-04 DIAGNOSIS — E669 Obesity, unspecified: Secondary | ICD-10-CM

## 2023-08-04 DIAGNOSIS — E559 Vitamin D deficiency, unspecified: Secondary | ICD-10-CM

## 2023-08-04 DIAGNOSIS — R739 Hyperglycemia, unspecified: Secondary | ICD-10-CM | POA: Diagnosis not present

## 2023-08-04 DIAGNOSIS — J069 Acute upper respiratory infection, unspecified: Secondary | ICD-10-CM | POA: Diagnosis not present

## 2023-08-04 DIAGNOSIS — Z6841 Body Mass Index (BMI) 40.0 and over, adult: Secondary | ICD-10-CM

## 2023-08-04 MED ORDER — CEFDINIR 300 MG PO CAPS: 300.0000 mg | ORAL_CAPSULE | Freq: Two times a day (BID) | ORAL | 0 refills | Status: DC

## 2023-08-04 MED ORDER — HYDROCODONE BIT-HOMATROP MBR 5-1.5 MG/5ML PO SOLN: 5.0000 mL | Freq: Four times a day (QID) | ORAL | 0 refills | Status: AC | PRN

## 2023-08-04 NOTE — Assessment & Plan Note (Signed)
 Pt now with mounjaro  5 mg weekly with higher than average GI side effect it seems, and marked wt loss even with lower dosing; pt willing to continue the 5 mg weekly for now, and for zofran  prn nausea, and miralax every day prn constipation

## 2023-08-04 NOTE — Assessment & Plan Note (Signed)
 Mild to mod, for antibx course omnicef 300 bid course, hycodan prn cough,  to f/u any worsening symptoms or concerns

## 2023-08-04 NOTE — Assessment & Plan Note (Signed)
 Lab Results  Component Value Date   HGBA1C 5.8 (H) 01/14/2022   Stable, pt to continue current medical treatment  - mounjaro  5 mg weekly

## 2023-08-04 NOTE — Patient Instructions (Signed)
 Please take all new medication as prescribed  - omnicef, and cough medicine as needed  Ok to use the OTC Miralax and/or the Senakot for constipation  Please continue all other medications as before, including the nausea medication, and the mounjaro  at 5 mg weekly  Please have the pharmacy call with any other refills you may need.  Please keep your appointments with your specialists as you may have planned

## 2023-08-04 NOTE — Progress Notes (Signed)
 Patient ID: Paul Parsons, male   DOB: 01/22/1995, 28 y.o.   MRN: [id number]        Chief Complaint: follow up obesity, with mounjaro  with alternating constipation diarrhea; also uri symptoms with ST and cough, and low vit d, hyperglycemia       HPI:  Paul Parsons is a 28 y.o. male here after 4 wks mounjaro  2.5 mg then last 2 wks on mounjaro  5 mg weekly, and has had significant wt loss over 20 lbs, but also recurring nausea with few vomiting episode, and alternating diarrhea/constipation  Denies worsening reflux, dysphagia, or blood. Pt denies chest pain, increased sob or doe, wheezing, orthopnea, PND, increased LE swelling, palpitations, dizziness or syncope.   Pt denies polydipsia, polyuria, or new focal neuro s/s.   Denies worsening depressive symptoms, suicidal ideation, or panic       Wt Readings from Last 3 Encounters:  08/04/23 (!) 310 lb (140.6 kg)  05/10/23 (!) 330 lb (149.7 kg)  03/09/23 (!) 327 lb (148.3 kg)   BP Readings from Last 3 Encounters:  08/04/23 126/72  05/10/23 130/80  03/09/23 130/84         Past Medical History:  Diagnosis Date   Abdominal pain, recurrent    Abdominal pain, recurrent    Asthma    Chronic constipation    Very large hard stools   Chronic constipation    IBS (irritable bowel syndrome)    Past Surgical History:  Procedure Laterality Date   CHOLECYSTECTOMY     ERCP  2012   MOUTH SURGERY      x 2 , wisdom teeth    reports that he has never smoked. He has never used smokeless tobacco. He reports that he does not drink alcohol and does not use drugs. family history includes Breast cancer in his mother. Allergies  Allergen Reactions   Tetracyclines & Related    Zithromax  [Azithromycin  Dihydrate]    Current Outpatient Medications on File Prior to Visit  Medication Sig Dispense Refill   albuterol  (VENTOLIN  HFA) 108 (90 Base) MCG/ACT inhaler Inhale into the lungs.     clonazePAM  (KLONOPIN ) 1 MG tablet Take 1 tablet (1 mg total) by mouth  daily as needed for anxiety. 30 tablet 5   Melatonin 10 MG TABS Take 10 mg by mouth daily as needed.      ondansetron  (ZOFRAN -ODT) 4 MG disintegrating tablet Take 1 tablet (4 mg total) by mouth every 8 (eight) hours as needed. 30 tablet 1   PRESCRIPTION MEDICATION Ibgard 1 2x daily     tirzepatide  5 MG/0.5ML injection vial Inject 5 mg into the skin once a week. 2 mL 11   triamcinolone  (NASACORT ) 55 MCG/ACT AERO nasal inhaler Place 2 sprays into the nose daily. 1 each 12   trimethoprim -polymyxin b  (POLYTRIM ) ophthalmic solution Place 2 drops into both eyes every 4 (four) hours. 10 mL 0   zolpidem  (AMBIEN ) 10 MG tablet Take 1 tablet (10 mg total) by mouth at bedtime as needed for sleep. 30 tablet 2   zolpidem  (AMBIEN ) 10 MG tablet Take 1 tablet (10 mg total) by mouth at bedtime as needed for sleep. 90 tablet 1   No current facility-administered medications on file prior to visit.        ROS:  All others reviewed and negative.  Objective        PE:  BP 126/72   Pulse 66   Temp 98.1 F (36.7 C) (Temporal)   Ht 5' 5 (1.651 m)  Wt (!) 310 lb (140.6 kg)   SpO2 97%   BMI 51.59 kg/m                 Constitutional: Pt appears mild ill               HENT: Head: NCAT.                Right Ear: External ear normal.                 Left Ear: External ear normal. Bilat tm's with mild erythema.  Max sinus areas non tender.  Pharynx with mild erythema, no exudate               Eyes: . Pupils are equal, round, and reactive to light. Conjunctivae and EOM are normal               Nose: without d/c or deformity               Neck: Neck supple. Gross normal ROM               Cardiovascular: Normal rate and regular rhythm.                 Pulmonary/Chest: Effort normal and breath sounds without rales or wheezing.                Abd:  Soft, NT, ND, + BS, no organomegaly               Neurological: Pt is alert. At baseline orientation, motor grossly intact               Skin: Skin is warm. No rashes,  no other new lesions, LE edema - none               Psychiatric: Pt behavior is normal without agitation   Micro: none  Cardiac tracings I have personally interpreted today:  none  Pertinent Radiological findings (summarize): none   Lab Results  Component Value Date   WBC 9.7 01/14/2022   HGB 14.2 01/14/2022   HCT 44.5 01/14/2022   PLT 339 01/14/2022   GLUCOSE 91 01/14/2022   CHOL 149 01/14/2022   TRIG 144 01/14/2022   HDL 44 01/14/2022   LDLCALC 80 01/14/2022   ALT 46 (H) 01/14/2022   AST 22 01/14/2022   NA 140 01/14/2022   K 4.2 01/14/2022   CL 101 01/14/2022   CREATININE 1.37 (H) 01/14/2022   BUN 15 01/14/2022   CO2 20 01/14/2022   TSH 3.450 01/14/2022   HGBA1C 5.8 (H) 01/14/2022   Assessment/Plan:  Paul Parsons is a 28 y.o. White or Caucasian [1] male with  has a past medical history of Abdominal pain, recurrent, Abdominal pain, recurrent, Asthma, Chronic constipation, Chronic constipation, and IBS (irritable bowel syndrome).  Vitamin D  deficiency Last vitamin D  Lab Results  Component Value Date   VD25OH 10.9 (L) 01/14/2022   Low, to start oral replacement   Obesity Pt now with mounjaro  5 mg weekly with higher than average GI side effect it seems, and marked wt loss even with lower dosing; pt willing to continue the 5 mg weekly for now, and for zofran  prn nausea, and miralax every day prn constipation  Hyperglycemia Lab Results  Component Value Date   HGBA1C 5.8 (H) 01/14/2022   Stable, pt to continue current medical treatment  - mounjaro  5 mg weekly   Acute upper respiratory infection Mild to  mod, for antibx course omnicef 300 bid course, hycodan prn cough,  to f/u any worsening symptoms or concerns  Followup: Return if symptoms worsen or fail to improve.  Paul Rush, MD 08/04/2023 8:52 PM [facility] 2 Internal Medicine

## 2023-08-04 NOTE — Assessment & Plan Note (Signed)
Last vitamin D Lab Results  Component Value Date   VD25OH 10.9 (L) 01/14/2022   Low, to start oral replacement

## 2023-08-08 NOTE — Telephone Encounter (Signed)
 Patient had since followed up with Dr.[name] 07/18 and issues were addressed

## 2023-08-08 NOTE — Telephone Encounter (Signed)
 Addressed by Dr.[name] during patient's visit on the following day.

## 2023-09-11 ENCOUNTER — Encounter: Payer: Self-pay | Admitting: Internal Medicine

## 2023-09-11 DIAGNOSIS — G4709 Other insomnia: Secondary | ICD-10-CM

## 2023-09-12 MED ORDER — ZOLPIDEM TARTRATE 10 MG PO TABS: 10.0000 mg | ORAL_TABLET | Freq: Every evening | ORAL | 1 refills | Status: DC | PRN

## 2023-09-26 ENCOUNTER — Ambulatory Visit: Admitting: Internal Medicine

## 2023-10-04 ENCOUNTER — Encounter: Payer: Self-pay | Admitting: Internal Medicine

## 2023-10-06 ENCOUNTER — Other Ambulatory Visit: Payer: Self-pay | Admitting: Internal Medicine

## 2023-10-06 MED ORDER — CLONAZEPAM 1 MG PO TABS: 1.0000 mg | ORAL_TABLET | Freq: Every day | ORAL | 5 refills | Status: DC | PRN

## 2023-10-06 NOTE — Telephone Encounter (Signed)
 Copied from CRM #[id number]. Topic: Clinical - Medication Refill >> Oct 06, 2023 10:08 AM Tanazia G wrote: Medication: clonazePAM  (KLONOPIN ) 1 MG tablet  Has the patient contacted their pharmacy? Yes (Agent: If no, request that the patient contact the pharmacy for the refill. If patient does not wish to contact the pharmacy document the reason why and proceed with request.) (Agent: If yes, when and what did the pharmacy advise?)  This is the patient's preferred pharmacy:  [id number] - [place], [place] - [address] AT [address] OF [facility] [address] [place]  [address] Phone: [phone] Fax: [phone]  [facility] - [place], [place] - [address] [facility] [address] [place] [address] [place] [address] Phone: [phone] Fax: [phone]  [facility] [place], [place] - [phone] [place] [address] Phone: [phone] Fax: [phone]  Is this the correct pharmacy for this prescription? Yes If no, delete pharmacy and type the correct one.   Has the prescription been filled recently? Yes  Is the patient out of the medication? Yes  Has the patient been seen for an appointment in the last year OR does the patient have an upcoming appointment? Yes  Can we respond through MyChart? Yes  Agent: Please be advised that Rx refills may take up to 3 business days. We ask that you follow-up with your pharmacy.

## 2023-12-03 ENCOUNTER — Encounter: Payer: Self-pay | Admitting: Internal Medicine

## 2023-12-04 MED ORDER — TIRZEPATIDE-WEIGHT MANAGEMENT 7.5 MG/0.5ML ~~LOC~~ SOLN: 7.5000 mg | SUBCUTANEOUS | 3 refills | Status: DC

## 2024-01-21 ENCOUNTER — Encounter: Payer: Self-pay | Admitting: Internal Medicine

## 2024-01-22 MED ORDER — CEFDINIR 300 MG PO CAPS: 300.0000 mg | ORAL_CAPSULE | Freq: Two times a day (BID) | ORAL | 0 refills | Status: DC

## 2024-01-22 MED ORDER — PROMETHAZINE-DM 6.25-15 MG/5ML PO SYRP: 5.0000 mL | ORAL_SOLUTION | Freq: Four times a day (QID) | ORAL | 0 refills | Status: DC | PRN

## 2024-01-31 ENCOUNTER — Encounter: Payer: Self-pay | Admitting: Internal Medicine

## 2024-01-31 ENCOUNTER — Ambulatory Visit: Admitting: Internal Medicine

## 2024-01-31 VITALS — BP 130/80 | HR 63 | Temp 97.8°F | Ht 65.0 in | Wt 261.0 lb

## 2024-01-31 DIAGNOSIS — E559 Vitamin D deficiency, unspecified: Secondary | ICD-10-CM | POA: Diagnosis not present

## 2024-01-31 DIAGNOSIS — K219 Gastro-esophageal reflux disease without esophagitis: Secondary | ICD-10-CM | POA: Diagnosis not present

## 2024-01-31 DIAGNOSIS — E7800 Pure hypercholesterolemia, unspecified: Secondary | ICD-10-CM | POA: Diagnosis not present

## 2024-01-31 DIAGNOSIS — R739 Hyperglycemia, unspecified: Secondary | ICD-10-CM | POA: Diagnosis not present

## 2024-01-31 DIAGNOSIS — G4709 Other insomnia: Secondary | ICD-10-CM

## 2024-01-31 MED ORDER — PANTOPRAZOLE SODIUM 40 MG PO TBEC: 40.0000 mg | DELAYED_RELEASE_TABLET | Freq: Every day | ORAL | 3 refills | Status: AC

## 2024-01-31 MED ORDER — CLONAZEPAM 1 MG PO TABS: 1.0000 mg | ORAL_TABLET | Freq: Every day | ORAL | 5 refills | Status: AC | PRN

## 2024-01-31 MED ORDER — ZOLPIDEM TARTRATE 10 MG PO TABS: 10.0000 mg | ORAL_TABLET | Freq: Every evening | ORAL | 1 refills | Status: AC | PRN

## 2024-01-31 NOTE — Progress Notes (Signed)
 Patient ID: Paul Parsons, male   DOB: January 24, 1995, 29 y.o.   MRN: [id number]        Chief Complaint: follow up GERD with cough and teeth damage, dm, low vit d       HPI:  Paul Parsons is a 29 y.o. male here with c/o uncontrolled reflux for several wks with persistent cough, and dentist noted teeth damage at last visit;  Pt denies chest pain, increased sob or doe, wheezing, orthopnea, PND, increased LE swelling, palpitations, dizziness or syncope.  Conts to do well on mounjaro  7.5 mg       Wt Readings from Last 3 Encounters:  01/31/24 261 lb (118.4 kg)  08/04/23 (!) 310 lb (140.6 kg)  05/10/23 (!) 330 lb (149.7 kg)   BP Readings from Last 3 Encounters:  01/31/24 130/80  08/04/23 126/72  05/10/23 130/80         Past Medical History:  Diagnosis Date   Abdominal pain, recurrent    Abdominal pain, recurrent    Asthma    Chronic constipation    Very large hard stools   Chronic constipation    IBS (irritable bowel syndrome)    Past Surgical History:  Procedure Laterality Date   CHOLECYSTECTOMY     ERCP  2012   MOUTH SURGERY      x 2 , wisdom teeth    reports that he has never smoked. He has never used smokeless tobacco. He reports that he does not drink alcohol and does not use drugs. family history includes Breast cancer in his mother. Allergies[1] Medications Ordered Prior to Encounter[2]      ROS:  All others reviewed and negative.  Objective        PE:  BP 130/80 (BP Location: Left Arm, Patient Position: Sitting, Cuff Size: Normal)   Pulse 63   Temp 97.8 F (36.6 C) (Oral)   Ht 5' 5 (1.651 m)   Wt 261 lb (118.4 kg)   SpO2 96%   BMI 43.43 kg/m                 Constitutional: Pt appears in NAD               HENT: Head: NCAT.                Right Ear: External ear normal.                 Left Ear: External ear normal.                Eyes: . Pupils are equal, round, and reactive to light. Conjunctivae and EOM are normal               Nose: without d/c or  deformity               Neck: Neck supple. Gross normal ROM               Cardiovascular: Normal rate and regular rhythm.                 Pulmonary/Chest: Effort normal and breath sounds without rales or wheezing.                Abd:  Soft, NT, ND, + BS, no organomegaly               Neurological: Pt is alert. At baseline orientation, motor grossly intact  Skin: Skin is warm. No rashes, no other new lesions, LE edema - none               Psychiatric: Pt behavior is normal without agitation   Micro: none  Cardiac tracings I have personally interpreted today:  none  Pertinent Radiological findings (summarize): none   Lab Results  Component Value Date   WBC 9.7 01/14/2022   HGB 14.2 01/14/2022   HCT 44.5 01/14/2022   PLT 339 01/14/2022   GLUCOSE 91 01/14/2022   CHOL 149 01/14/2022   TRIG 144 01/14/2022   HDL 44 01/14/2022   LDLCALC 80 01/14/2022   ALT 46 (H) 01/14/2022   AST 22 01/14/2022   NA 140 01/14/2022   K 4.2 01/14/2022   CL 101 01/14/2022   CREATININE 1.37 (H) 01/14/2022   BUN 15 01/14/2022   CO2 20 01/14/2022   TSH 3.450 01/14/2022   HGBA1C 5.8 (H) 01/14/2022   Assessment/Plan:  Paul Parsons is a 29 y.o. White or Caucasian [1] male with  has a past medical history of Abdominal pain, recurrent, Abdominal pain, recurrent, Asthma, Chronic constipation, Chronic constipation, and IBS (irritable bowel syndrome).  Vitamin D  deficiency Last vitamin D  Lab Results  Component Value Date   VD25OH 10.9 (L) 01/14/2022   Low, to start oral replacement   Hyperglycemia Lab Results  Component Value Date   HGBA1C 5.8 (H) 01/14/2022   Stable, pt to continue current medical treatment mounjaro  7.5 gm weekly   GERD (gastroesophageal reflux disease) Mod to severe recently despite wt loss, for protonix  40 mg qd,  to f/u any worsening symptoms or concerns  Followup: Return in about 6 months (around 07/30/2024).  Paul Rush, MD 02/03/2024 7:44 PM [facility] Internal Medicine    [1]  Allergies Allergen Reactions   Tetracyclines & Related    Zithromax  [Azithromycin  Dihydrate]   [2]  Current Outpatient Medications on File Prior to Visit  Medication Sig Dispense Refill   albuterol  (VENTOLIN  HFA) 108 (90 Base) MCG/ACT inhaler Inhale into the lungs.     Melatonin 10 MG TABS Take 10 mg by mouth daily as needed.      PRESCRIPTION MEDICATION Ibgard 1 2x daily     tirzepatide  7.5 MG/0.5ML injection vial Inject 7.5 mg into the skin once a week. 6 mL 3   triamcinolone  (NASACORT ) 55 MCG/ACT AERO nasal inhaler Place 2 sprays into the nose daily. 1 each 12   trimethoprim -polymyxin b  (POLYTRIM ) ophthalmic solution Place 2 drops into both eyes every 4 (four) hours. 10 mL 0   No current facility-administered medications on file prior to visit.

## 2024-01-31 NOTE — Patient Instructions (Signed)
 Please take all new medication as prescribed - the protonix  40 mg per day for reflux, but you may not need this forever if you continue to lose weight  Please continue all other medications as before, and refills have been done if requested.  Please have the pharmacy call with any other refills you may need.  Please continue your efforts at being more active, low cholesterol diet, and weight control.  You are otherwise up to date with prevention measures today.  Please keep your appointments with your specialists as you may have planned  Please go to the LAB at the blood drawing area for the tests to be done  You will be contacted by phone if any changes need to be made immediately.  Otherwise, you will receive a letter about your results with an explanation, but please check with MyChart first.  Please make an Appointment to return in 6 months, or sooner if needed

## 2024-02-03 ENCOUNTER — Encounter: Payer: Self-pay | Admitting: Internal Medicine

## 2024-02-03 DIAGNOSIS — K219 Gastro-esophageal reflux disease without esophagitis: Secondary | ICD-10-CM | POA: Insufficient documentation

## 2024-02-03 NOTE — Assessment & Plan Note (Signed)
Last vitamin D Lab Results  Component Value Date   VD25OH 10.9 (L) 01/14/2022   Low, to start oral replacement

## 2024-02-03 NOTE — Assessment & Plan Note (Signed)
 Mod to severe recently despite wt loss, for protonix  40 mg qd,  to f/u any worsening symptoms or concerns

## 2024-02-03 NOTE — Assessment & Plan Note (Signed)
 Lab Results  Component Value Date   HGBA1C 5.8 (H) 01/14/2022   Stable, pt to continue current medical treatment mounjaro  7.5 gm weekly

## 2024-02-14 ENCOUNTER — Encounter: Payer: Self-pay | Admitting: Internal Medicine

## 2024-02-14 MED ORDER — TIRZEPATIDE-WEIGHT MANAGEMENT 7.5 MG/0.5ML ~~LOC~~ SOLN: 7.5000 mg | SUBCUTANEOUS | 3 refills | Status: AC

## 2024-03-12 ENCOUNTER — Encounter: Admitting: Family Medicine
